# Patient Record
Sex: Female | Born: 1975 | Hispanic: Yes | Marital: Married | Smoking: Former smoker
Health system: Southern US, Community
[De-identification: ages and names within clinical notes are randomized; demographics above are authoritative.]

## PROBLEM LIST (undated history)

## (undated) DIAGNOSIS — O09529 Supervision of elderly multigravida, unspecified trimester: Secondary | ICD-10-CM

## (undated) DIAGNOSIS — S299XXA Unspecified injury of thorax, initial encounter: Secondary | ICD-10-CM

## (undated) HISTORY — PX: CHOLECYSTECTOMY: SHX55

## (undated) HISTORY — DX: Supervision of elderly multigravida, unspecified trimester: O09.529

---

## 2001-11-03 ENCOUNTER — Other Ambulatory Visit: Admission: RE | Admit: 2001-11-03 | Discharge: 2001-11-03 | Payer: Self-pay | Admitting: Obstetrics and Gynecology

## 2002-03-22 ENCOUNTER — Inpatient Hospital Stay (HOSPITAL_COMMUNITY): Admission: AD | Admit: 2002-03-22 | Discharge: 2002-03-25 | Payer: Self-pay | Admitting: *Deleted

## 2002-04-02 ENCOUNTER — Inpatient Hospital Stay (HOSPITAL_COMMUNITY): Admission: AD | Admit: 2002-04-02 | Discharge: 2002-04-02 | Payer: Self-pay | Admitting: *Deleted

## 2004-01-26 ENCOUNTER — Encounter (INDEPENDENT_AMBULATORY_CARE_PROVIDER_SITE_OTHER): Payer: Self-pay | Admitting: *Deleted

## 2004-01-26 ENCOUNTER — Other Ambulatory Visit: Admission: RE | Admit: 2004-01-26 | Discharge: 2004-01-26 | Payer: Self-pay | Admitting: Family Medicine

## 2004-01-26 ENCOUNTER — Ambulatory Visit: Payer: Self-pay | Admitting: Family Medicine

## 2004-02-16 ENCOUNTER — Ambulatory Visit: Payer: Self-pay | Admitting: Family Medicine

## 2005-08-15 ENCOUNTER — Inpatient Hospital Stay (HOSPITAL_COMMUNITY): Admission: AD | Admit: 2005-08-15 | Discharge: 2005-08-17 | Payer: Self-pay | Admitting: Obstetrics

## 2005-08-22 ENCOUNTER — Inpatient Hospital Stay (HOSPITAL_COMMUNITY): Admission: RE | Admit: 2005-08-22 | Discharge: 2005-08-25 | Payer: Self-pay | Admitting: Obstetrics

## 2007-03-04 ENCOUNTER — Inpatient Hospital Stay (HOSPITAL_COMMUNITY): Admission: EM | Admit: 2007-03-04 | Discharge: 2007-03-06 | Payer: Self-pay | Admitting: Emergency Medicine

## 2007-03-05 ENCOUNTER — Encounter (INDEPENDENT_AMBULATORY_CARE_PROVIDER_SITE_OTHER): Payer: Self-pay | Admitting: Surgery

## 2007-03-09 ENCOUNTER — Inpatient Hospital Stay (HOSPITAL_COMMUNITY): Admission: EM | Admit: 2007-03-09 | Discharge: 2007-03-12 | Payer: Self-pay | Admitting: Emergency Medicine

## 2007-07-13 ENCOUNTER — Ambulatory Visit (HOSPITAL_COMMUNITY): Admission: RE | Admit: 2007-07-13 | Discharge: 2007-07-13 | Payer: Self-pay | Admitting: Gastroenterology

## 2008-10-10 ENCOUNTER — Emergency Department (HOSPITAL_COMMUNITY): Admission: EM | Admit: 2008-10-10 | Discharge: 2008-10-11 | Payer: Self-pay | Admitting: Emergency Medicine

## 2008-11-04 ENCOUNTER — Emergency Department (HOSPITAL_COMMUNITY): Admission: EM | Admit: 2008-11-04 | Discharge: 2008-11-04 | Payer: Self-pay | Admitting: Emergency Medicine

## 2008-12-04 ENCOUNTER — Emergency Department (HOSPITAL_COMMUNITY): Admission: EM | Admit: 2008-12-04 | Discharge: 2008-12-04 | Payer: Self-pay | Admitting: Emergency Medicine

## 2008-12-05 ENCOUNTER — Emergency Department (HOSPITAL_COMMUNITY): Admission: EM | Admit: 2008-12-05 | Discharge: 2008-12-06 | Payer: Self-pay | Admitting: Emergency Medicine

## 2009-05-07 ENCOUNTER — Emergency Department (HOSPITAL_COMMUNITY): Admission: EM | Admit: 2009-05-07 | Discharge: 2009-05-07 | Payer: Self-pay | Admitting: Emergency Medicine

## 2010-06-21 LAB — DIFFERENTIAL
Basophils Relative: 0 % (ref 0–1)
Eosinophils Absolute: 0 10*3/uL (ref 0.0–0.7)
Eosinophils Absolute: 0 10*3/uL (ref 0.0–0.7)
Lymphocytes Relative: 14 % (ref 12–46)
Lymphs Abs: 1.2 10*3/uL (ref 0.7–4.0)
Lymphs Abs: 1.9 10*3/uL (ref 0.7–4.0)
Monocytes Absolute: 0.4 10*3/uL (ref 0.1–1.0)
Monocytes Absolute: 0.4 10*3/uL (ref 0.1–1.0)
Monocytes Relative: 4 % (ref 3–12)

## 2010-06-21 LAB — CBC
Platelets: 164 10*3/uL (ref 150–400)
Platelets: 178 10*3/uL (ref 150–400)
RDW: 12.9 % (ref 11.5–15.5)
WBC: 8.5 10*3/uL (ref 4.0–10.5)

## 2010-06-21 LAB — COMPREHENSIVE METABOLIC PANEL
ALT: 96 U/L — ABNORMAL HIGH (ref 0–35)
AST: 41 U/L — ABNORMAL HIGH (ref 0–37)
Albumin: 4 g/dL (ref 3.5–5.2)
Albumin: 4.2 g/dL (ref 3.5–5.2)
Alkaline Phosphatase: 58 U/L (ref 39–117)
BUN: 10 mg/dL (ref 6–23)
Calcium: 8.5 mg/dL (ref 8.4–10.5)
Chloride: 102 mEq/L (ref 96–112)
Creatinine, Ser: 0.47 mg/dL (ref 0.4–1.2)
GFR calc Af Amer: >60 mL/min (ref >60)
GFR calc Af Amer: >60 mL/min (ref >60)
GFR calc non Af Amer: >60 mL/min (ref >60)
Glucose, Bld: 136 mg/dL — ABNORMAL HIGH (ref 70–99)
Potassium: 3.6 mEq/L (ref 3.5–5.1)
Total Bilirubin: 0.8 mg/dL (ref 0.3–1.2)
Total Protein: 7.1 g/dL (ref 6.0–8.3)

## 2010-06-21 LAB — URINALYSIS, ROUTINE W REFLEX MICROSCOPIC
Bilirubin Urine: NEGATIVE
Ketones, ur: NEGATIVE mg/dL
Ketones, ur: NEGATIVE mg/dL
Nitrite: NEGATIVE
Protein, ur: NEGATIVE mg/dL
Urobilinogen, UA: 1 mg/dL (ref 0.0–1.0)
pH: 7.5 (ref 5.0–8.0)

## 2010-06-21 LAB — GC/CHLAMYDIA PROBE AMP, GENITAL: GC Probe Amp, Genital: NEGATIVE

## 2010-06-21 LAB — LIPASE, BLOOD
Lipase: 33 U/L (ref 11–59)
Lipase: 35 U/L (ref 11–59)

## 2010-06-21 LAB — WET PREP, GENITAL

## 2010-06-22 LAB — URINALYSIS, ROUTINE W REFLEX MICROSCOPIC
Glucose, UA: NEGATIVE mg/dL
Hgb urine dipstick: NEGATIVE
Ketones, ur: NEGATIVE mg/dL
Protein, ur: NEGATIVE mg/dL
pH: 6 (ref 5.0–8.0)

## 2010-06-22 LAB — URINE MICROSCOPIC-ADD ON

## 2010-06-22 LAB — POCT PREGNANCY, URINE: Preg Test, Ur: NEGATIVE

## 2010-06-23 LAB — CBC
MCHC: 33.6 g/dL (ref 30.0–36.0)
MCV: 99.2 fL (ref 78.0–100.0)
Platelets: 180 10*3/uL (ref 150–400)
RBC: 4.13 MIL/uL (ref 3.87–5.11)
WBC: 8.2 10*3/uL (ref 4.0–10.5)

## 2010-06-23 LAB — URINALYSIS, ROUTINE W REFLEX MICROSCOPIC
Bilirubin Urine: NEGATIVE
Hgb urine dipstick: NEGATIVE
Nitrite: NEGATIVE
Protein, ur: NEGATIVE mg/dL
Specific Gravity, Urine: 1.01 (ref 1.005–1.030)
Urobilinogen, UA: 0.2 mg/dL (ref 0.0–1.0)

## 2010-06-23 LAB — COMPREHENSIVE METABOLIC PANEL
Alkaline Phosphatase: 44 U/L (ref 39–117)
BUN: 10 mg/dL (ref 6–23)
CO2: 23 mEq/L (ref 19–32)
Chloride: 114 mEq/L — ABNORMAL HIGH (ref 96–112)
GFR calc non Af Amer: >60 mL/min (ref >60)
Glucose, Bld: 94 mg/dL (ref 70–99)
Potassium: 4 mEq/L (ref 3.5–5.1)
Total Bilirubin: 0.3 mg/dL (ref 0.3–1.2)
Total Protein: 7.1 g/dL (ref 6.0–8.3)

## 2010-06-23 LAB — DIFFERENTIAL
Basophils Absolute: 0 10*3/uL (ref 0.0–0.1)
Basophils Relative: 1 % (ref 0–1)
Neutro Abs: 5.8 10*3/uL (ref 1.7–7.7)
Neutrophils Relative %: 70 % (ref 43–77)

## 2010-06-23 LAB — POCT PREGNANCY, URINE: Preg Test, Ur: NEGATIVE
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
## 2010-07-30 NOTE — Consult Note (Signed)
Isabel Castillo, Isabel Castillo          ACCOUNT NO.:  [id number]   MEDICAL RECORD NO.:  [id number]          PATIENT TYPE:  INP   LOCATION:  1528                         FACILITY:  [facility]   PHYSICIAN:  Graylin Shiver, M.D.   DATE OF BIRTH:  1975/07/16   DATE OF CONSULTATION:  DATE OF DISCHARGE:                                 CONSULTATION   REASON FOR CONSULTATION:  The patient is a 35 year old Hispanic female  status post laparoscopic cholecystectomy on March 05, 2007, for  gallstones.  The patient had normal intraoperative cholangiogram at the  time of surgery.  She did well postoperatively until this morning around  4:00 a.m. when she developed abdominal pain.  She states that the pain  is in her abdomen and she points to the left side of her abdomen.  Rubs  her hand up and down the left side but also the epigastric area as well.  She came to the emergency room where she was seen and evaluated by Dr.  Ovidio Kin who is admitting her.  Her liver function tests are  elevated.  Bilirubin is 0.9 but alkaline phosphatase is 204, aspartate  aminotransferase 85, alanine aminotransferase 209.  She has not been  vomiting.  She does tell me that she has not had a bowel movement in  about five days.  Her lipase is normal.   ALLERGIES:  None known.   MEDICATIONS:  None.   PAST MEDICAL HISTORY:  No chronic medical problems.   SOCIAL HISTORY:  Does not smoke or drink alcohol.   REVIEW OF SYSTEMS:  She is not complaining of any chest pain, shortness  of breath, cough or sputum production.   PHYSICAL EXAMINATION:  VITAL SIGNS:  Vital signs are stable.  GENERAL:  She is not in any distress.  HEAD, EARS, EYES,  NOSE AND THROAT:  Nonicteric.  HEART:  Regular rhythm.  No murmurs.  LUNGS:  Clear.  ABDOMEN:  Bowel sounds are normal.  It is soft.  She does have some  tenderness on the left side and also in the epigastric area.  No rebound  or guarding.   IMPRESSION:  1. Abdominal pain.  2.  Elevated liver enzymes.  3. Status post laparoscopic cholecystectomy.  4. Rule out common bile duct stone although she did have normal      intraoperative cholangiogram.  5. Rule out bile leak.   PLAN:  Surgery is admitting her to the hospital.  We will check the HIDA  scan, recheck liver function tests.  She may need an endoscopic  retrograde cholangiopancreatography, not sure yet tonight.  We will  reassess the situation in the morning.           ______________________________  Graylin Shiver, M.D.     SFG/MEDQ  D:  03/09/2007  T:  03/10/2007  Job:  [id number]   cc:   [facility]
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
## 2010-07-30 NOTE — Op Note (Signed)
NAMESU, DUMA          ACCOUNT NO.:  [id number]   MEDICAL RECORD NO.:  [id number]          PATIENT TYPE:  INP   LOCATION:  1528                         FACILITY:  [facility]   PHYSICIAN:  Graylin Shiver, M.D.   DATE OF BIRTH:  Jun 27, 1975   DATE OF PROCEDURE:  03/10/2007  DATE OF DISCHARGE:                               OPERATIVE REPORT   Endoscopic retrograde cholangiogram with biliary stent placement.   INDICATION:  A 35 year old female status post laparoscopic  cholecystectomy a few days ago, presented with abdominal pain.  HIDA  scan showed a bile leak.  ERCP with stent placement planned due to bile  leak.   Informed consent was obtained after explanation of the risks of  bleeding, infection, perforation and pancreatitis.   PREMEDICATION:  Fentanyl 125 mcg IV, Versed 8 mg IV.   PROCEDURE:  With the patient lying on the fluoroscopy table on her  abdomen, the lateral viewing duodenum scope was inserted into the  oropharynx and passed into the esophagus.  It was advanced down the  esophagus, then into the stomach and into the duodenum.  No specific  lesions were seen in the stomach or the duodenum.  The papilla of Vater  looked normal.  Cannulation was achieved initially at one time of the  pancreatic duct with a guidewire.  The catheter was repositioned, and  then the second attempt at cannulation was successful at cannulating the  common bile duct.  The guidewire was advanced up the biliary tree.  Contrast was injected into the biliary tree.  No stones were seen.  There was a lush of contrast seen peripherally out to the right of the  clips consistent with a bile leak.  This may be from a duct of Luschka.  The guidewire was secured, the papillotome was removed, and then a 5-cm  8.5 [place] plastic stent was placed into the bile duct.  She tolerated  the procedure well without complications.   IMPRESSION:  Bile leak, biliary stent placed.     ______________________________  Graylin Shiver, M.D.     SFG/MEDQ  D:  03/10/2007  T:  03/10/2007  Job:  [id number]   cc:   Angelia Mould. Derrell Lolling, M.D.  [address], [address]  [place]  [place] [address]

## 2010-07-30 NOTE — Op Note (Signed)
[name], Isabel Castillo               ACCOUNT NO.:  [id number]   MEDICAL RECORD NO.:  [id number]          PATIENT TYPE:  AMB   LOCATION:  ENDO                         FACILITY:  [facility]   PHYSICIAN:  Graylin Shiver, M.D.   DATE OF BIRTH:  [birth year]   DATE OF PROCEDURE:  07/13/2007  DATE OF DISCHARGE:                               OPERATIVE REPORT   INDICATIONS:  The patient has had a biliary stent placed several months  ago after a bile leak post cholecystectomy.  She is doing well.  It is  now time to remove the stent.   Informed consent was obtained after explanation of the risks of  bleeding, infection, and perforation.   PREMEDICATION:  1. Fentanyl 75 mcg IV.  2. Versed 10 mg IV.   PROCEDURE:  With the patient in the left lateral decubitus position, the  lateral viewing duodenoscope was inserted into the oropharynx and passed  into the esophagus.  It was advanced down the esophagus then into the  stomach.  No lesions were seen.  The duodenum was then entered.  No  lesions were seen.  The biliary stent was seen.  It was grasped with  snare and removed.  She tolerated the procedure well without  complications.   IMPRESSION:  Biliary stent removed.           ______________________________  Graylin Shiver, M.D.     SFG/MEDQ  D:  07/13/2007  T:  07/14/2007  Job:  [id number]   cc:   [facility]
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
## 2010-08-02 NOTE — Discharge Summary (Signed)
NAMEKARY, Isabel Castillo          ACCOUNT NO.:  [id number]   MEDICAL RECORD NO.:  [id number]          PATIENT TYPE:  INP   LOCATION:  1613                         FACILITY:  [facility]   PHYSICIAN:  Angelia Mould. Derrell Lolling, M.D.DATE OF BIRTH:  1975/12/03   DATE OF ADMISSION:  03/09/2007  DATE OF DISCHARGE:  03/12/2007                               DISCHARGE SUMMARY   FINAL DIAGNOSES:  1. Bile leak.  2. Chronic cholecystitis with cholelithiasis, status post laparoscopic      cholecystectomy.   OPERATIONS PERFORMED:  ERCP with sphincterotomy and stent placement.   HISTORY:  This is a 35 year old Hispanic patient who does not speak much  [place].  She was admitted to hospital by Dr. Harriette Bouillon on March 03, 2007 with gallstone pancreatitis.  She recovered from that fairly  quickly, and on March 05, 2007, I took her to the operating room and  did a laparoscopic cholecystectomy.  She had an intraoperative  cholangiogram which was normal.  The surgery was uneventful, and she was  discharged on March 06, 2007.   She was doing well until 4:00 a.m. on the morning of March 09, 2007  when she awoke with epigastric pain radiating to her back, fairly sudden  onset.  She came to the emergency room.  Liver function tests were  slightly elevated.  Bilirubin was normal.  Her lipase was 27.  She was  evaluated by Dr. Ovidio Kin and admitted to the hospital for further  evaluation and management.   PHYSICAL EXAMINATION:  VITAL SIGNS:  Temperature 98.1, blood pressure  117/77, pulse 75, respirations 20.  LUNGS:  Clear to auscultation.  ABDOMEN:  Soft, active bowel sounds.  No guarding, rebound, or  peritoneal signs.  No obvious mass or hernia.   HOSPITAL COURSE:  Dr. Ezzard Standing was concerned about retained common duct  stones as well as leak.  HIDA scan showed a bile leak.  Dr. Evette Cristal was  called.  The patient was placed on antibiotics.  The patient had an ERCP  with a stent placed by Dr.  Evette Cristal.  He felt that there was a small duct  of [place] which was causing the leak.   On March 12, 2007, the patient was doing well, markedly improved  pain, tolerating a diet, no tenderness.  She was discharged home on  March 12, 2007.  She was to follow up with me in the office in 1-2  weeks.  Dr. Evette Cristal will remove the stent in 6-8 weeks.      Angelia Mould. Derrell Lolling, M.D.  Electronically Signed     HMI/MEDQ  D:  03/28/2007  T:  03/29/2007  Job:  [id number]

## 2010-11-10 ENCOUNTER — Emergency Department (HOSPITAL_COMMUNITY)
Admission: EM | Admit: 2010-11-10 | Discharge: 2010-11-10 | Disposition: A | Discharge status: Home / Self Care | Payer: Self-pay | Attending: Emergency Medicine | Admitting: Emergency Medicine

## 2010-11-10 DIAGNOSIS — R109 Unspecified abdominal pain: Secondary | ICD-10-CM | POA: Insufficient documentation

## 2010-11-10 DIAGNOSIS — Z9089 Acquired absence of other organs: Secondary | ICD-10-CM | POA: Insufficient documentation

## 2010-11-10 DIAGNOSIS — R10816 Epigastric abdominal tenderness: Secondary | ICD-10-CM | POA: Insufficient documentation

## 2010-12-20 LAB — COMPREHENSIVE METABOLIC PANEL
ALT: 183 — ABNORMAL HIGH
ALT: 209 — ABNORMAL HIGH
AST: 23
AST: 85 — ABNORMAL HIGH
Albumin: 3.4 — ABNORMAL LOW
Albumin: 4
Alkaline Phosphatase: 204 — ABNORMAL HIGH
Alkaline Phosphatase: 213 — ABNORMAL HIGH
Alkaline Phosphatase: 58
BUN: 1 — ABNORMAL LOW
BUN: 20
CO2: 28
Calcium: 9.1
Chloride: 104
Chloride: 111
Chloride: 99
Creatinine, Ser: 0.52
GFR calc Af Amer: >60
GFR calc Af Amer: >60
GFR calc non Af Amer: >60
Glucose, Bld: 107 — ABNORMAL HIGH
Glucose, Bld: 195 — ABNORMAL HIGH
Potassium: 3.7
Potassium: 3.8
Potassium: 3.9
Sodium: 133 — ABNORMAL LOW
Sodium: 140
Sodium: 142
Total Bilirubin: 0.6
Total Bilirubin: 0.7
Total Bilirubin: 1.1
Total Protein: 5.8 — ABNORMAL LOW
Total Protein: 6.1
Total Protein: 7.6

## 2010-12-20 LAB — DIFFERENTIAL
Basophils Absolute: 0
Basophils Absolute: 0
Basophils Absolute: 0
Basophils Relative: 0
Basophils Relative: 0
Basophils Relative: 0
Eosinophils Absolute: 0 — ABNORMAL LOW
Eosinophils Absolute: 0.1 — ABNORMAL LOW
Lymphocytes Relative: 18
Monocytes Absolute: 0.4
Monocytes Absolute: 0.5
Monocytes Relative: 5
Monocytes Relative: 7
Neutro Abs: 6.9
Neutro Abs: 7.6
Neutrophils Relative %: 77
Neutrophils Relative %: 79 — ABNORMAL HIGH

## 2010-12-20 LAB — URINALYSIS, ROUTINE W REFLEX MICROSCOPIC
Bilirubin Urine: NEGATIVE
Bilirubin Urine: NEGATIVE
Glucose, UA: NEGATIVE
Ketones, ur: 15 — AB
Ketones, ur: NEGATIVE
Nitrite: NEGATIVE
Nitrite: NEGATIVE
Specific Gravity, Urine: 1.024
Urobilinogen, UA: 1
pH: 6

## 2010-12-20 LAB — URINE MICROSCOPIC-ADD ON

## 2010-12-20 LAB — CBC
HCT: 35.9 — ABNORMAL LOW
HCT: 37.3
Hemoglobin: 12.7
Hemoglobin: 12.8
Hemoglobin: 14.4
MCHC: 34.9
MCV: 94.9
MCV: 95.2
Platelets: 169
Platelets: 186
RBC: 3.59 — ABNORMAL LOW
RBC: 3.81 — ABNORMAL LOW
RBC: 4.35
RDW: 13.4
RDW: 13.9
WBC: 5.2
WBC: 8.4
WBC: 9.9

## 2010-12-20 LAB — PREGNANCY, URINE: Preg Test, Ur: NEGATIVE

## 2010-12-20 LAB — AMYLASE
Amylase: 67
Amylase: 73

## 2010-12-20 LAB — URINE CULTURE

## 2010-12-20 LAB — LIPASE, BLOOD
Lipase: 36
Lipase: 969 — ABNORMAL HIGH

## 2010-12-20 LAB — POCT PREGNANCY, URINE: Preg Test, Ur: NEGATIVE

## 2016-01-09 DIAGNOSIS — F331 Major depressive disorder, recurrent, moderate: Secondary | ICD-10-CM | POA: Insufficient documentation

## 2016-07-01 ENCOUNTER — Ambulatory Visit: Payer: Self-pay

## 2016-07-24 ENCOUNTER — Encounter (INDEPENDENT_AMBULATORY_CARE_PROVIDER_SITE_OTHER): Payer: Self-pay | Admitting: Physician Assistant

## 2016-07-24 ENCOUNTER — Ambulatory Visit (INDEPENDENT_AMBULATORY_CARE_PROVIDER_SITE_OTHER): Payer: Self-pay | Admitting: Physician Assistant

## 2016-07-24 VITALS — BP 117/77 | HR 79 | Temp 98.2°F | Ht 62.0 in | Wt 175.6 lb

## 2016-07-24 DIAGNOSIS — K2990 Gastroduodenitis, unspecified, without bleeding: Secondary | ICD-10-CM | POA: Insufficient documentation

## 2016-07-24 DIAGNOSIS — K2970 Gastritis, unspecified, without bleeding: Secondary | ICD-10-CM

## 2016-07-24 DIAGNOSIS — M5442 Lumbago with sciatica, left side: Secondary | ICD-10-CM

## 2016-07-24 DIAGNOSIS — G8929 Other chronic pain: Secondary | ICD-10-CM

## 2016-07-24 DIAGNOSIS — M5441 Lumbago with sciatica, right side: Secondary | ICD-10-CM

## 2016-07-24 MED ORDER — CYCLOBENZAPRINE HCL 5 MG PO TABS: 5.0000 mg | ORAL_TABLET | Freq: Every day | ORAL | 1 refills | Status: DC

## 2016-07-24 MED ORDER — NAPROXEN 500 MG PO TABS: 500.0000 mg | ORAL_TABLET | Freq: Two times a day (BID) | ORAL | 0 refills | Status: DC

## 2016-07-24 MED ORDER — OMEPRAZOLE 40 MG PO CPDR: 40.0000 mg | DELAYED_RELEASE_CAPSULE | Freq: Every day | ORAL | 3 refills | Status: DC

## 2016-07-24 NOTE — Progress Notes (Signed)
Subjective:  Patient ID: Isabel Castillo, female    DOB: 03-19-1975  Age: 41 y.o. MRN: [id number]  CC: back pain  HPI Isabel Castillo is a 41 y.o. 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 Onset of lower back pain one year ago after a fall. Complains of LBP when standing for at least 2-3 hours. Reports bilateral radiculopathy down the LLE and RLE. Pt unable to describe pain but it is not sharp. Pain 8/10 at worst. Aggravated with forward flexion of back and when carrying approximately 20lbs. Also notes sitting for prolonged periods makes her back hurt. Icy Hot relieves pain. Denies saddle paresthesia, urinary/bowel incontinence, HA, CP, or SOB.     ROS Review of Systems  Constitutional: Negative for chills, fever and malaise/fatigue.  Eyes: Negative for blurred vision.  Respiratory: Negative for shortness of breath.   Cardiovascular: Negative for chest pain and palpitations.  Gastrointestinal: Negative for abdominal pain and nausea.  Genitourinary: Negative for dysuria and hematuria.  Musculoskeletal: Positive for back pain. Negative for joint pain and myalgias.  Skin: Negative for rash.  Neurological: Negative for headaches.       Radiculopathy from back pain  Psychiatric/Behavioral: Negative for depression. The patient is not nervous/anxious.     Objective:  BP 117/77 (BP Location: Left Arm, Patient Position: Sitting, Cuff Size: Normal)   Pulse 79   Temp 98.2 F (36.8 C) (Oral)   Ht 5\' 2"  (1.575 m)   Wt 175 lb 9.6 oz (79.7 kg)   LMP 07/09/2016 (Exact Date)   SpO2 97%   BMI 32.12 kg/m   BP/Weight 07/24/2016  Systolic BP 117  Diastolic BP 77  Wt. (Lbs) 175.6  BMI 32.12      Physical Exam  Constitutional: She is oriented to person, place, and time.  Well developed, overweight, NAD, polite  HENT:  Head: Normocephalic and atraumatic.  Eyes: No scleral icterus.  Neck: Normal range  of motion.  Cardiovascular: Normal rate, regular rhythm and normal heart sounds.   Pulmonary/Chest: Effort normal and breath sounds normal.  Musculoskeletal: She exhibits no edema.  Full aROM of back, UEs, and LEs.  Neurological: She is alert and oriented to person, place, and time. No cranial nerve deficit. Coordination normal.  Seated SLR negative bilaterally.  Skin: Skin is warm and dry. No rash noted. No erythema. No pallor.  Psychiatric: She has a normal mood and affect. Her behavior is normal. Thought content normal.  Vitals reviewed.    Assessment & Plan:   1. Chronic bilateral low back pain with bilateral sciatica - DG Lumbar Spine Complete; Future - Begin cyclobenzaprine (FLEXERIL) 5 MG tablet; Take 1 tablet (5 mg total) by mouth at bedtime.  Dispense: 10 tablet; Refill: 1 - Begin naproxen (NAPROSYN) 500 MG tablet; Take 1 tablet (500 mg total) by mouth 2 (two) times daily with a meal.  Dispense: 10 tablet; Refill: 0 * Pt reluctant to take meds but was educated on the importance of reducing inflammation.  2. Gastritis and gastroduodenitis - Begin omeprazole (PRILOSEC) 40 MG capsule; Take 1 capsule (40 mg total) by mouth daily.  Dispense: 30 capsule; Refill: 3   Meds ordered this encounter  Medications  . cyclobenzaprine (FLEXERIL) 5 MG tablet    Sig: Take 1 tablet (5 mg total) by mouth at bedtime.    Dispense:  10 tablet    Refill:  1    Order Specific  Question:   Supervising Provider    Answer:   Quentin AngstJEGEDE, OLUGBEMIGA E [[id number]]  . naproxen (NAPROSYN) 500 MG tablet    Sig: Take 1 tablet (500 mg total) by mouth 2 (two) times daily with a meal.    Dispense:  10 tablet    Refill:  0    Order Specific Question:   Supervising Provider    Answer:   Quentin AngstJEGEDE, OLUGBEMIGA E [id number]]  . omeprazole (PRILOSEC) 40 MG capsule    Sig: Take 1 capsule (40 mg total) by mouth daily.    Dispense:  30 capsule    Refill:  3    Order Specific Question:   Supervising Provider    Answer:    Quentin AngstJEGEDE, OLUGBEMIGA E [[id number]]    Follow-up: Return in about 4 weeks (around 08/21/2016) for full physical .   Loletta Specteroger David Minie Roadcap PA

## 2016-07-24 NOTE — Patient Instructions (Signed)

## 2016-07-28 ENCOUNTER — Ambulatory Visit (HOSPITAL_COMMUNITY)
Admission: RE | Admit: 2016-07-28 | Discharge: 2016-07-28 | Disposition: A | Discharge status: Home / Self Care | Payer: Self-pay | Source: Ambulatory Visit | Attending: Physician Assistant | Admitting: Physician Assistant

## 2016-07-28 ENCOUNTER — Other Ambulatory Visit (INDEPENDENT_AMBULATORY_CARE_PROVIDER_SITE_OTHER): Payer: Self-pay | Admitting: Physician Assistant

## 2016-07-28 DIAGNOSIS — M5442 Lumbago with sciatica, left side: Secondary | ICD-10-CM

## 2016-07-28 DIAGNOSIS — G8929 Other chronic pain: Secondary | ICD-10-CM | POA: Insufficient documentation

## 2016-07-28 DIAGNOSIS — M5441 Lumbago with sciatica, right side: Secondary | ICD-10-CM | POA: Insufficient documentation

## 2016-07-28 DIAGNOSIS — M4306 Spondylolysis, lumbar region: Secondary | ICD-10-CM

## 2016-07-28 NOTE — Progress Notes (Signed)
Pars defect L5 left side.

## 2016-08-06 ENCOUNTER — Ambulatory Visit: Payer: Self-pay | Attending: Physician Assistant

## 2016-08-15 ENCOUNTER — Ambulatory Visit (INDEPENDENT_AMBULATORY_CARE_PROVIDER_SITE_OTHER): Payer: Self-pay | Admitting: Physician Assistant

## 2016-08-15 ENCOUNTER — Ambulatory Visit (INDEPENDENT_AMBULATORY_CARE_PROVIDER_SITE_OTHER): Payer: Self-pay

## 2016-09-08 ENCOUNTER — Ambulatory Visit (INDEPENDENT_AMBULATORY_CARE_PROVIDER_SITE_OTHER): Payer: Self-pay | Admitting: Physician Assistant

## 2016-09-09 ENCOUNTER — Ambulatory Visit (INDEPENDENT_AMBULATORY_CARE_PROVIDER_SITE_OTHER): Payer: Self-pay | Admitting: Orthopaedic Surgery

## 2016-09-09 ENCOUNTER — Ambulatory Visit (INDEPENDENT_AMBULATORY_CARE_PROVIDER_SITE_OTHER): Payer: Self-pay | Admitting: Physician Assistant

## 2016-09-09 ENCOUNTER — Encounter (INDEPENDENT_AMBULATORY_CARE_PROVIDER_SITE_OTHER): Payer: Self-pay | Admitting: Physician Assistant

## 2016-09-09 ENCOUNTER — Other Ambulatory Visit (HOSPITAL_COMMUNITY)
Admission: RE | Admit: 2016-09-09 | Discharge: 2016-09-09 | Disposition: A | Discharge status: Home / Self Care | Payer: Self-pay | Source: Ambulatory Visit | Attending: Physician Assistant | Admitting: Physician Assistant

## 2016-09-09 VITALS — BP 136/83 | HR 74 | Temp 98.4°F | Ht 62.5 in | Wt 174.4 lb

## 2016-09-09 DIAGNOSIS — R21 Rash and other nonspecific skin eruption: Secondary | ICD-10-CM

## 2016-09-09 DIAGNOSIS — B009 Herpesviral infection, unspecified: Secondary | ICD-10-CM | POA: Insufficient documentation

## 2016-09-09 DIAGNOSIS — Z114 Encounter for screening for human immunodeficiency virus [HIV]: Secondary | ICD-10-CM

## 2016-09-09 DIAGNOSIS — Z30013 Encounter for initial prescription of injectable contraceptive: Secondary | ICD-10-CM

## 2016-09-09 DIAGNOSIS — Z124 Encounter for screening for malignant neoplasm of cervix: Secondary | ICD-10-CM

## 2016-09-09 DIAGNOSIS — B9689 Other specified bacterial agents as the cause of diseases classified elsewhere: Secondary | ICD-10-CM | POA: Insufficient documentation

## 2016-09-09 DIAGNOSIS — Z0000 Encounter for general adult medical examination without abnormal findings: Secondary | ICD-10-CM

## 2016-09-09 DIAGNOSIS — Z23 Encounter for immunization: Secondary | ICD-10-CM

## 2016-09-09 LAB — POCT URINALYSIS DIPSTICK
BILIRUBIN UA: NEGATIVE
Blood, UA: NEGATIVE
Glucose, UA: NEGATIVE
KETONES UA: NEGATIVE
LEUKOCYTES UA: NEGATIVE
Nitrite, UA: NEGATIVE
Protein, UA: NEGATIVE
Spec Grav, UA: <=1.005 — AB (ref 1.010–1.025)
Urobilinogen, UA: 0.2 E.U./dL
pH, UA: 6.5 (ref 5.0–8.0)

## 2016-09-09 LAB — POCT URINE PREGNANCY: Preg Test, Ur: NEGATIVE

## 2016-09-09 MED ORDER — CLOTRIMAZOLE 1 % EX CREA: 1.0000 "application " | TOPICAL_CREAM | Freq: Two times a day (BID) | CUTANEOUS | 0 refills | Status: DC

## 2016-09-09 MED ORDER — MEDROXYPROGESTERONE ACETATE 150 MG/ML IM SUSP
150.0000 mg | Freq: Once | INTRAMUSCULAR | Status: AC
  Administered 2016-09-09: 150 mg via INTRAMUSCULAR

## 2016-09-09 NOTE — Progress Notes (Addendum)
Subjective:  Patient ID: Isabel Castillo, female    DOB: 22-Jul-1975  Age: 41 y.o. MRN: [id number]  CC: annual exam  HPI Isabel Castillo is a 41 y.o. female with a PMH of Left sided L5 pars defect presents for an annual exam. Says she feels generally well except for intermittent back pain. Was found to have left sided pars defect by XR on 07/28/16. She has scheduled to receive treatment with a chiropractor. She also has a rash on the RLE since approximately two weeks ago. Has used her daughter's steroid cream and OTC hydrocortisone without relief. She has excoriated the skin due to intense itching. Itching is worse when she touches the lesions.     ROS Review of Systems  Constitutional: Negative for chills, fever and malaise/fatigue.  Eyes: Negative for blurred vision.  Respiratory: Negative for shortness of breath.   Cardiovascular: Negative for chest pain and palpitations.  Gastrointestinal: Negative for abdominal pain and nausea.  Genitourinary: Negative for dysuria and hematuria.  Musculoskeletal: Positive for back pain. Negative for joint pain and myalgias.  Skin: Negative for rash.  Neurological: Negative for tingling and headaches.  Psychiatric/Behavioral: Negative for depression. The patient is not nervous/anxious.     Objective:  BP 136/83 (BP Location: Left Arm, Patient Position: Sitting, Cuff Size: Normal)   Pulse 74   Temp 98.4 F (36.9 C) (Oral)   Ht 5' 2.5" (1.588 m)   Wt 174 lb 6.4 oz (79.1 kg)   LMP 08/30/2016 (Exact Date)   SpO2 99%   BMI 31.39 kg/m   BP/Weight 09/09/2016 07/24/2016  Systolic BP 136 117  Diastolic BP 83 77  Wt. (Lbs) 174.4 175.6  BMI 31.39 32.12      Physical Exam  Constitutional: She is oriented to person, place, and time.  Well developed, overweight, NAD, polite  HENT:  Head: Normocephalic and atraumatic.  Eyes: Conjunctivae and EOM are normal. Pupils are equal, round, and reactive to light. No scleral icterus.  Neck: Normal  range of motion. Neck supple. No thyromegaly present.  Cardiovascular: Normal rate, regular rhythm and normal heart sounds.   Pulmonary/Chest: Effort normal and breath sounds normal. No respiratory distress. She has no wheezes. She has no rales.  Abdominal: Soft. Bowel sounds are normal. She exhibits no distension and no mass. There is no tenderness. There is no rebound and no guarding.  No hepatosplenomegaly   Genitourinary:  Genitourinary Comments: Vagina normal, physiologic discharge, cervix with nabothian cyst, no cervical motion tenderness, no adnexal tenderness or mass bilaterally, no uterine mass or tenderness.  Musculoskeletal: She exhibits no edema or deformity.  LEs, UEs, and back with full aROM. No pain elicited with back ROM.  Lymphadenopathy:    She has no cervical adenopathy.  Neurological: She is alert and oriented to person, place, and time. She has normal reflexes. No cranial nerve deficit. Coordination normal.  Skin: Skin is warm and dry. No rash noted. No erythema. No pallor.  Psychiatric: She has a normal mood and affect. Her behavior is normal. Thought content normal.  Vitals reviewed.    Assessment & Plan:   1. Annual physical exam - CBC with Differential; Future - Comprehensive metabolic panel; Future - Lipid Panel; Future - Urinalysis Dipstick  2. Screening for HIV (human immunodeficiency virus) - HIV antibody; Future  3. Need for tetanus, diphtheria, and acellular pertussis (Tdap) vaccine - Tdap vaccine greater than or equal to 7yo IM  4. Screening for cervical cancer - Cytology - PAP Pierson  5. Rash and nonspecific skin eruption - Suspected eczema by history but steroids did not seem to help. Unable to discern what the rash is 2/2 steroid treatments, age, and multiple excoriations. - Begin clotrimazole (LOTRIMIN) 1 % cream; Apply 1 application topically 2 (two) times daily.  Dispense: 30 g; Refill: 0  6. Encounter for initial prescription of  injectable contraceptive - Administered Depo Provera 150 mg per mL  Meds ordered this encounter  Medications  . clotrimazole (LOTRIMIN) 1 % cream    Sig: Apply 1 application topically 2 (two) times daily.    Dispense:  30 g    Refill:  0    Order Specific Question:   Supervising Provider    Answer:   Quentin Angst [id number]    Follow-up: Return if symptoms worsen or fail to improve.   Loletta Specter PA

## 2016-09-09 NOTE — Patient Instructions (Signed)
Prueba de Papanicolaou  (Pap Test)  ¿POR QUÉ ME DEBO REALIZAR ESTA PRUEBA?  A esta prueba también se la denomina "frotis de Pap". Es una prueba de detección que se utiliza para detectar signos de cáncer de vagina, cuello del útero y útero. La prueba también puede identificar la presencia de infección o cambios precancerosos. El médico probablemente le recomiende que se realice esta prueba en forma regular. Esta prueba puede realizarse de la siguiente manera:  · Cada 3 años, a partir de los 21 años.  · Cada 5 años, en combinación con las pruebas que se realizan para detectar la presencia del virus del papiloma humano (VPH).  · Con mayor o menor frecuencia, en función de otras enfermedades que tenga.  ¿QUÉ TIPO DE MUESTRA SE TOMA?  El médico utilizará un pequeño hisopo de algodón, una espátula de plástico o un cepillo para recolectar una muestra de células de la superficie del cuello del útero. El cuello del útero es la apertura del útero, que también se conoce como matriz. También pueden recolectarse las secreciones del cuello del útero y la vagina.  ¿CÓMO DEBO PREPARARME PARA ESTA PRUEBA?  · Tenga en cuenta en qué etapa del ciclo menstrual se encuentra. Es posible que deba reprogramar la prueba si está menstruando el día en que debe realizársela.  · Si el día en que debe realizarse la prueba tiene una infección vaginal aparente, deberá reprogramar la prueba.  · Pueden pedirle que evite tomar una ducha o baño el día de la prueba o el día anterior.  · Algunos medicamentos pueden provocar resultados anormales de la prueba, como los digitálicos y la tetraciclina. Si toma alguno de estos medicamentos, hable con su médico antes de realizarse la prueba.  ¿QUÉ SIGNIFICAN LOS RESULTADOS?  Los resultados anormales de la prueba pueden indicar diversas enfermedades. Estas pueden incluir lo siguiente:  · Cáncer. Si bien los resultados de la prueba de Papanicolaou no pueden  utilizarse para diagnosticar cáncer de cuello del útero, de vagina o de útero, pueden indicar que existe una posibilidad de presencia de cáncer. En este caso, será necesario realizar pruebas adicionales para determinar la presencia de cáncer.  · Enfermedad de transmisión sexual.  · Infecciones por hongos.  · Infección por parásitos.  · Infección por herpes.  · Una enfermedad que causa o favorece la infertilidad.  Es su responsabilidad retirar el resultado del estudio. Consulte en el laboratorio o en el departamento en el que fue realizado el estudio cuándo y cómo podrá obtener los resultados. Comuníquese con el médico si tiene preguntas sobre los resultados.  Esta información no tiene como fin reemplazar el consejo del médico. Asegúrese de hacerle al médico cualquier pregunta que tenga.  Document Released: 08/20/2007 Document Revised: 03/24/2014 Document Reviewed: 07/25/2013  Elsevier Interactive Patient Education © 2018 Elsevier Inc.

## 2016-09-11 LAB — CYTOLOGY - PAP
Bacterial vaginitis: POSITIVE — AB
CHLAMYDIA, DNA PROBE: NEGATIVE
Candida vaginitis: NEGATIVE
Diagnosis: NEGATIVE
NEISSERIA GONORRHEA: NEGATIVE
TRICH (WINDOWPATH): NEGATIVE

## 2016-09-12 ENCOUNTER — Telehealth (INDEPENDENT_AMBULATORY_CARE_PROVIDER_SITE_OTHER): Payer: Self-pay | Admitting: Physician Assistant

## 2016-09-12 ENCOUNTER — Other Ambulatory Visit (INDEPENDENT_AMBULATORY_CARE_PROVIDER_SITE_OTHER): Payer: Self-pay

## 2016-09-12 ENCOUNTER — Other Ambulatory Visit (INDEPENDENT_AMBULATORY_CARE_PROVIDER_SITE_OTHER): Payer: Self-pay | Admitting: Physician Assistant

## 2016-09-12 ENCOUNTER — Ambulatory Visit (INDEPENDENT_AMBULATORY_CARE_PROVIDER_SITE_OTHER): Payer: Self-pay | Admitting: Orthopaedic Surgery

## 2016-09-12 DIAGNOSIS — M545 Low back pain, unspecified: Secondary | ICD-10-CM

## 2016-09-12 DIAGNOSIS — N760 Acute vaginitis: Principal | ICD-10-CM

## 2016-09-12 DIAGNOSIS — B9689 Other specified bacterial agents as the cause of diseases classified elsewhere: Secondary | ICD-10-CM

## 2016-09-12 DIAGNOSIS — Z0000 Encounter for general adult medical examination without abnormal findings: Secondary | ICD-10-CM

## 2016-09-12 DIAGNOSIS — Z114 Encounter for screening for human immunodeficiency virus [HIV]: Secondary | ICD-10-CM

## 2016-09-12 MED ORDER — METRONIDAZOLE 500 MG PO TABS: 500.0000 mg | ORAL_TABLET | Freq: Two times a day (BID) | ORAL | 0 refills | Status: AC

## 2016-09-12 NOTE — Telephone Encounter (Signed)
Per Sindy Messingoger Gomez PA schedule appt.  Will repeat labs.

## 2016-09-12 NOTE — Progress Notes (Signed)
PAP shows BV.

## 2016-09-12 NOTE — Telephone Encounter (Signed)
FWD to PCP. Tempestt S Roberts, CMA  

## 2016-09-12 NOTE — Progress Notes (Addendum)
Office Visit Note orthopedic consultation   Patient: Isabel Castillo           Date of Birth: 1975/08/29           MRN: [id number] Visit Date: 09/12/2016              Requested by: Loletta Specter, PA-C [address] [place], [place] [address] PCP: Loletta Specter, PA-C   Assessment & Plan: Visit Diagnoses:  1. 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 We will set her up for some physical therapy recheck in 4 weeks and if she's having persistent symptoms we can consider MRI scan imaging. Thank for the opportunity to see her  in consultation.  Follow-Up Instructions: Return in about 4 weeks (around 10/10/2016).   Orders:  No orders of the defined types were placed in this encounter.  No orders of the defined types were placed in this encounter.     Procedures: No procedures performed   Clinical Data: No additional findings.   Subjective: No chief complaint on file.   HPI 41 year old female here with her teenage daughter with problems with low back pain since she fell on the floor at home about one year ago. She states she started hurting about a week after she fell and states his been constant with bilateral buttocks pain and pain that radiates under her legs. She is continuing to work. She's taken anti-inflammatories without relief. She does use some intermittent heat. She denies associated bowel or bladder symptoms. Radiographs 07/28/2016 showed pars defect at L5 on the left with intact pars on the right. She had no spondylolisthesis. In 2010 on pack she had a CT scan for another reason but the pars appears intact on pelvic CT scan at that time. Patient denies chills or fever. Pain is worse if she stands for long time or she walks a lot which is what she does at work. She is taking Naprosyn 500 mg twice a day with some improvement.  Review of  Systems 14 point review of system positive for a previous childbirth, history of gastritis duodenal ulcers, vaginal infection, low back pain 1 year.   Objective: Vital Signs: LMP 08/30/2016 (Exact Date)   Physical Exam  Constitutional: She is oriented to person, place, and time. She appears well-developed.  HENT:  Head: Normocephalic.  Right Ear: External ear normal.  Left Ear: External ear normal.  Eyes: Pupils are equal, round, and reactive to light.  Neck: No tracheal deviation present. No thyromegaly present.  Cardiovascular: Normal rate.   Pulmonary/Chest: Effort normal.  Abdominal: Soft.  Musculoskeletal:  Patient has good hip internal and external rotation without pain. Mild discomfort with straight leg raising at 90. She has some sciatic notch tenderness both right and left minimal trochanteric bursal tenderness. Quad and hamstrings are strong she is able to heel and toe walk. Reflexes are 1+ and symmetrical. No rash over exposed skin of venous stasis changes. Pedal pulses are normal.  Neurological: She is alert and oriented to person, place, and time.  Skin: Skin is warm and dry.  Psychiatric: She has a normal mood and affect. Her behavior is normal.    Ortho Exam no clonus in the lower extremities. Normal nail beds. Good capillary refill. EHL anterior tib peroneal scale gastrocsoleus are strong. Negative Faber test.  Specialty Comments:  No specialty comments available.  Imaging:  No results found.   PMFS History: Patient Active Problem List   Diagnosis Date Noted  . Gastritis and gastroduodenitis 07/24/2016   No past medical history on file.  No family history on file.  No past surgical history on file. Social History   Occupational History  . Not on file.   Social History Main Topics  . Smoking status: Never Smoker  . Smokeless tobacco: Never Used  . Alcohol use Not on file  . Drug use: Unknown  . Sexual activity: Not on file

## 2016-09-12 NOTE — Telephone Encounter (Signed)
Patient came in today stated needs to further discuss lab results regarding pap smear. Concerns regarding infection in vagina, is there a treatment? And how long do you think she has had the infection?  Stated she saw on the a video regarding infection and it said that if just started with infection that there is treatment. Also wants to know if results 100% accurate.  Patient declined appt this time. Stated needs only to ask this question.

## 2016-09-13 LAB — CBC WITH DIFFERENTIAL/PLATELET
BASOS ABS: 0 10*3/uL (ref 0.0–0.2)
Basos: 0 %
EOS (ABSOLUTE): 0.1 10*3/uL (ref 0.0–0.4)
Eos: 1 %
Hematocrit: 39.9 % (ref 34.0–46.6)
Hemoglobin: 13.5 g/dL (ref 11.1–15.9)
IMMATURE GRANS (ABS): 0 10*3/uL (ref 0.0–0.1)
Immature Granulocytes: 0 %
LYMPHS: 31 %
Lymphocytes Absolute: 1.5 10*3/uL (ref 0.7–3.1)
MCH: 33.2 pg — AB (ref 26.6–33.0)
MCHC: 33.8 g/dL (ref 31.5–35.7)
MCV: 98 fL — ABNORMAL HIGH (ref 79–97)
Monocytes Absolute: 0.3 10*3/uL (ref 0.1–0.9)
Monocytes: 6 %
Neutrophils Absolute: 3 10*3/uL (ref 1.4–7.0)
Neutrophils: 62 %
PLATELETS: 228 10*3/uL (ref 150–379)
RBC: 4.07 x10E6/uL (ref 3.77–5.28)
RDW: 14.2 % (ref 12.3–15.4)
WBC: 4.9 10*3/uL (ref 3.4–10.8)

## 2016-09-13 LAB — COMPREHENSIVE METABOLIC PANEL
ALK PHOS: 58 IU/L (ref 39–117)
ALT: 26 IU/L (ref 0–32)
AST: 19 IU/L (ref 0–40)
Albumin/Globulin Ratio: 1.6 (ref 1.2–2.2)
Albumin: 4.2 g/dL (ref 3.5–5.5)
BUN/Creatinine Ratio: 23 (ref 9–23)
BUN: 11 mg/dL (ref 6–20)
Bilirubin Total: 0.4 mg/dL (ref 0.0–1.2)
CO2: 22 mmol/L (ref 20–29)
Calcium: 8.9 mg/dL (ref 8.7–10.2)
Chloride: 105 mmol/L (ref 96–106)
Creatinine, Ser: 0.47 mg/dL — ABNORMAL LOW (ref 0.57–1.00)
GFR calc Af Amer: 144 mL/min/{1.73_m2} (ref >59)
GFR calc non Af Amer: 125 mL/min/{1.73_m2} (ref >59)
GLUCOSE: 100 mg/dL — AB (ref 65–99)
Globulin, Total: 2.7 g/dL (ref 1.5–4.5)
Potassium: 4.1 mmol/L (ref 3.5–5.2)
Sodium: 140 mmol/L (ref 134–144)
Total Protein: 6.9 g/dL (ref 6.0–8.5)

## 2016-09-13 LAB — LIPID PANEL
CHOLESTEROL TOTAL: 197 mg/dL (ref 100–199)
Chol/HDL Ratio: 3 ratio (ref 0.0–4.4)
HDL: 65 mg/dL (ref >39)
LDL Calculated: 118 mg/dL — ABNORMAL HIGH (ref 0–99)
Triglycerides: 71 mg/dL (ref 0–149)
VLDL CHOLESTEROL CAL: 14 mg/dL (ref 5–40)

## 2016-09-13 LAB — HIV ANTIBODY (ROUTINE TESTING W REFLEX): HIV Screen 4th Generation wRfx: NONREACTIVE

## 2016-09-19 ENCOUNTER — Other Ambulatory Visit (INDEPENDENT_AMBULATORY_CARE_PROVIDER_SITE_OTHER): Payer: Self-pay | Admitting: Physician Assistant

## 2016-09-19 DIAGNOSIS — E785 Hyperlipidemia, unspecified: Secondary | ICD-10-CM

## 2016-09-19 MED ORDER — LOVASTATIN 20 MG PO TABS: 20.0000 mg | ORAL_TABLET | Freq: Every day | ORAL | 3 refills | Status: DC

## 2016-09-25 ENCOUNTER — Ambulatory Visit (INDEPENDENT_AMBULATORY_CARE_PROVIDER_SITE_OTHER): Payer: Self-pay | Admitting: Physician Assistant

## 2016-10-01 ENCOUNTER — Encounter (INDEPENDENT_AMBULATORY_CARE_PROVIDER_SITE_OTHER): Payer: Self-pay | Admitting: Physician Assistant

## 2016-10-01 ENCOUNTER — Ambulatory Visit (INDEPENDENT_AMBULATORY_CARE_PROVIDER_SITE_OTHER): Payer: Self-pay | Admitting: Physician Assistant

## 2016-10-01 ENCOUNTER — Ambulatory Visit: Payer: Self-pay | Attending: Orthopaedic Surgery | Admitting: Rehabilitative and Restorative Service Providers"

## 2016-10-01 ENCOUNTER — Encounter: Payer: Self-pay | Admitting: Rehabilitative and Restorative Service Providers"

## 2016-10-01 VITALS — BP 117/75 | HR 81 | Temp 97.9°F | Wt 170.4 lb

## 2016-10-01 DIAGNOSIS — E785 Hyperlipidemia, unspecified: Secondary | ICD-10-CM

## 2016-10-01 DIAGNOSIS — M545 Low back pain: Secondary | ICD-10-CM | POA: Insufficient documentation

## 2016-10-01 DIAGNOSIS — G8929 Other chronic pain: Secondary | ICD-10-CM | POA: Insufficient documentation

## 2016-10-01 DIAGNOSIS — M6281 Muscle weakness (generalized): Secondary | ICD-10-CM | POA: Insufficient documentation

## 2016-10-01 DIAGNOSIS — R293 Abnormal posture: Secondary | ICD-10-CM | POA: Insufficient documentation

## 2016-10-01 DIAGNOSIS — R21 Rash and other nonspecific skin eruption: Secondary | ICD-10-CM

## 2016-10-01 MED ORDER — TRIAMCINOLONE ACETONIDE 0.5 % EX CREA: 1.0000 "application " | TOPICAL_CREAM | Freq: Two times a day (BID) | CUTANEOUS | 0 refills | Status: DC

## 2016-10-01 NOTE — Patient Instructions (Signed)
Eczema (Eczema) El eczema, tambin llamada dermatitis atpica, es una afeccin de la piel que causa inflamacin de la misma. Este trastorno produce una erupcin roja y sequedad y escamas en la piel. Hay gran picazn. El eczema generalmente empeora durante los meses fros del invierno y generalmente desaparece o mejora con el tiempo clido del verano. El eczema generalmente comienza a manifestarse en la infancia. Algunos nios desarrollan este trastorno y ste puede prolongarse en la Estate manager/land agentadultez. CAUSAS La causa exacta no se conoce pero parece ser una afeccin hereditaria. Generalmente las personas que sufren eczema tienen una historia familiar de eczema, alergias, asma o fiebre de heno. Esta enfermedad no es contagiosa. Algunas causas de los brotes pueden ser:  Contacto con alguna cosa a la que es sensible o Best boyalrgico.  Librarian, academicstrs. SIGNOS Y SNTOMAS  Piel seca y escamosa.  Erupcin roja y que pica.  Picazn. Esta puede ocurrir antes de que aparezca la erupcin y puede ser muy intensa.  DIAGNSTICO El diagnstico de eczema se realiza basndose en los sntomas y en la historia clnica. TRATAMIENTO El eczema no puede curarse, pero los sntomas generalmente pueden controlarse con tratamiento y Development worker, communityotras estrategias. Un plan de tratamiento puede incluir:  Control de la picazn y el rascado. ? Utilice antihistamnicos de venta libre segn las indicaciones, para Associate Professoraliviar la picazn. Es especialmente til por las noches cuando la picazn tiende a Theme park managerempeorar. ? Utilice medicamentos de venta libre para la picazn, segn las indicaciones del mdico. ? Evite rascarse. El rascado hace que la picazn empeore. Tambin puede producir una infeccin en la piel (imptigo) debido a las lesiones en la piel causadas por el rascado.  Mantenga la piel bien humectada con cremas, todos Maupinlos das. La piel quedar hmeda y ayudar a prevenir la sequedad. Las lociones que contengan alcohol y agua deben evitarse debido a que pueden  Best boysecar la piel.  Limite la exposicin a las cosas a las que es sensible o alrgico (alrgenos).  Reconozca las situaciones que puedan causar estrs.  Desarrolle un plan para controlar el estrs. INSTRUCCIONES PARA EL CUIDADO EN EL HOGAR  Tome slo medicamentos de venta libre o recetados, segn las indicaciones del mdico.  No aplique nada sobre la piel sin Science writerconsultar a su mdico.  Deber tomar baos o duchas de corta duracin (5 minutos) en agua tibia (no caliente). Use jabones suaves para el bao. No deben tener perfume. Puede agregar aceite de bao no perfumado al agua del bao. Es Manufacturing engineermejor evitar el jabn y el bao de espuma.  Inmediatamente despus del bao o de la ducha, cuando la piel aun est hmeda, aplique una crema humectante en todo el cuerpo. Este ungento debe ser en base a vaselina. La piel quedar hmeda y ayudar a prevenir la sequedad. Cuanto ms espeso sea el ungento, mejor. No deben tener perfume.  Mantenga las uas cortas. Es posible que los nios con eczema necesiten usar guantes o mitones por la noche, despus de aplicarse el ungento.  Vista al McGraw-Hillnio con ropa de algodn o Chief of Staffmezcla de algodn. Vstalo con ropas ligeras ya que el calor aumenta la picazn.  Un nio con eczema debe permanecer alejado de personas que tengan ampollas febriles o llagas del resfro. El virus que causa las ampollas febriles (herpes simple) puede ocasionar una infeccin grave en la piel de los nios que padecen eczema.  SOLICITE ATENCIN MDICA SI:  La picazn le impide dormir.  La erupcin empeora o no mejora dentro de la semana en la que se inicia el  Observa pus o costras amarillas en la zona de la erupcin.  Tiene fiebre.  Aparece un brote despus de haber estado en contacto con alguna persona que tiene ampollas febriles. Esta informacin no tiene como fin reemplazar el consejo del mdico. Asegrese de hacerle al mdico cualquier pregunta que tenga. Document Released:  03/03/2005 Document Revised: 12/22/2012 Document Reviewed: 10/04/2012 Elsevier Interactive Patient Education  2017 Elsevier Inc.  

## 2016-10-01 NOTE — Therapy (Signed)
[facility] [address] [place], [place], [address] Phone: [phone]   Fax:  [phone]  Physical Therapy Evaluation  Patient Details  Name: Isabel StagerMonica Soto Castillo MRN: [id number] Date of Birth: [birth year] Referring Provider: Annell GreeningMark Yates, MD  Encounter Date: 10/01/2016      PT End of Session - 10/01/16 1128    Visit Number 1   Number of Visits 12   Date for PT Re-Evaluation 11/12/16   PT Start Time 1020   PT Stop Time 1104   PT Time Calculation (min) 44 min   Activity Tolerance Patient tolerated treatment well;No increased pain   Behavior During Therapy [facility] for tasks assessed/performed      History reviewed. No pertinent past medical history.  History reviewed. No pertinent surgical history.  There were no vitals filed for this visit.       Subjective Assessment - 10/01/16 1024    Subjective I have been having LBP when I walk or stand too   Pt needs interpreter   Patient is accompained by: Family member   Pertinent History LBP x 1 year after a fall where she slipped and fell backwards. Saw a chiropractor for about 4 visits after fall and was not pleased. 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 3 hours   How long can you stand comfortably? 3 hours   How long can you walk comfortably? can keep walking but hurts, unable to give pain number   Diagnostic tests pars defect on the L of L5   Patient Stated Goals to not have pain   Currently in Pain? Yes   Pain Score 7    Pain Location Back   Pain Orientation Right;Left   Pain Descriptors / Indicators --  unable to describe pain   Pain Type Chronic pain   Pain Radiating Towards bil posterior mid Hamstrings   Pain Onset More than a month ago   Pain Frequency Intermittent   Aggravating Factors  static standing (unable to give time), bending to the side to use machine at work, carrying  heavy items   Pain Relieving Factors icy hot spray, leaning to the side to offset weight   Effect of Pain on Daily Activities able to perform activities but modifies to be less pain.   Multiple Pain Sites No            [facility] PT Assessment - 10/01/16 0001      Assessment   Medical Diagnosis LBP with L5 pars defect on the L   Referring Provider Annell GreeningMark Yates, MD   Onset Date/Surgical Date --  over a year ago   Hand Dominance Right   Next MD Visit TBD   Prior Therapy none     Precautions   Precautions None     Restrictions   Weight Bearing Restrictions No     Balance Screen   Has the patient fallen in the past 6 months No     Home Environment   Living Environment Private residence     Prior Function   Level of Independence Independent     Cognition   Overall Cognitive Status Within Functional Limits for tasks assessed     Observation/Other Assessments   Observations none     Sensation   Light Touch Appears Intact     Coordination   Gross Motor Movements are Fluid and  Coordinated Yes   Fine Motor Movements are Fluid and Coordinated Yes     Squat   Comments unequal WB     Sit to Stand   Comments [facility]     Posture/Postural Control   Posture Comments sits slouched, offsets weight frequently     ROM / Strength   AROM / PROM / Strength AROM;Strength     AROM   Overall AROM Comments lumbar AROM WNL except bil rot 25% limited;  L2-3 pivot point.with extension, pt deviates to R too with extension.      Strength   Overall Strength Comments bil LE strength 5/5; lumbar/core strength poor +     Flexibility   Soft Tissue Assessment /Muscle Length --  Hamstrings tight bil lacking 20-25 degrees to full extension     Palpation   Spinal mobility WNL   SI assessment  L sacral border deeper in prone   Palpation comment R Hamstring tighter, R thoracolumbar paraspinals tighter beginning at T8, R ITB/Piriformis tighter, L sacral border deeper, R ASIS higher, leg length =      Special Tests    Special Tests --  SLR + bil for Hamstring tightness            Objective measurements completed on examination: See above findings.                  PT Education - 10/01/16 1120    Education provided Yes   Education Details HEP: supine Hamstring stretch with towel, prayer stretch in neutral and each side 2x30 sec each 2x/day   Person(s) Educated Patient;Child(ren);Caregiver(s)   Methods Explanation;Demonstration;Handout   Comprehension Verbalized understanding;Returned demonstration          PT Short Term Goals - 10/01/16 1124      PT SHORT TERM GOAL #1   Title Pt will be I with initial and intermediate HEP to assist with LBP pain management and flexiblity with work duties   Baseline issued at eval   Time 3   Period Weeks   Status New     PT SHORT TERM GOAL #2   Title Pt will demo proper body mechanics lifting 10 lb box for work simulation    Baseline unable   Time 3   Period Weeks   Status New     PT SHORT TERM GOAL #3   Title Pt will be able to statically stand x 5 min with 50% bil LBP and bil gluteal/Hamstring pain to perform ADLs/work duties   Baseline unable   Time 3   Period Weeks   Status New           PT Long Term Goals - 10/01/16 1126      PT LONG TERM GOAL #1   Title Pt will demo improved lumbar/core strength to good + to assist with decreased LBP with all activities   Baseline poor +   Time 6   Period Weeks   Status New     PT LONG TERM GOAL #2   Title Pt will demo improved lumbar/LE pain to </= 3/10 after work shift   Baseline 7/10   Time 6   Period Weeks   Status New     PT LONG TERM GOAL #3   Title Pt will be able to use machine at work (where she needs to laterally bend to use) with 75% less pain   Baseline unable   Time 6   Period Weeks   Status New  Plan - 10/01/16 1121    Clinical Impression Statement Pt presents to PT with sacral symmetry, lumbar/core weakness, bil  Hamstring Piriformis tightness, R thoracolumbar paraspinal tightness, R Piriformis/ITB tightness. Pt would benefit from PT for lumbar/core strengthening and lumbar flexibility, manual therapy for SI correction and improvement in lumbar flexibility, R hip flexibility, and body mechanics training. 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 MD order also states spondylolisthesis.    History and Personal Factors relevant to plan of care: pars defect L5, spondylolisthesis   Clinical Presentation Stable   Clinical Decision Making Low   Rehab Potential Excellent   PT Frequency 2x / week   PT Duration 6 weeks   PT Treatment/Interventions ADLs/Self Care Home Management;Cryotherapy;Electrical Stimulation;Iontophoresis 4mg /ml Dexamethasone;Functional mobility training;Ultrasound;Moist Heat;Therapeutic activities;Therapeutic exercise;Patient/family education;Manual techniques;Dry needling;Taping   PT Next Visit Plan review HEP, lumbar flexibility, lumbar/core strengthening, SI assessment and correction   PT Home Exercise Plan see pt education   Recommended Other Services needs interpreter   Consulted and Agree with Plan of Care Patient;Family member/caregiver   Family Member Consulted daughter      Patient will benefit from skilled therapeutic intervention in order to improve the following deficits and impairments:  Decreased mobility, Decreased range of motion, Decreased strength, Impaired flexibility, Postural dysfunction, Pain  Visit Diagnosis: Chronic midline low back pain without sciatica - Plan: PT plan of care cert/re-cert, PT plan of care cert/re-cert  Muscle weakness (generalized) - Plan: PT plan of care cert/re-cert, PT plan of care cert/re-cert  Posture abnormality - Plan: PT plan of care cert/re-cert, PT plan of care cert/re-cert     Problem List Patient Active Problem List   Diagnosis Date Noted  . Gastritis and gastroduodenitis 07/24/2016    Isabel Castillo,Isabel Castillo, PT 10/01/2016, 11:37 AM  [facility] [address] [place], [place], [address] Phone: [phone]   Fax:  [phone]  Name: Isabel Castillo MRN: [id number] Date of Birth: 1975-03-22

## 2016-10-01 NOTE — Progress Notes (Signed)
Pt complains of her head spinning, she states that after drinking a few cups of water it goes away.  She is also requesting a different cream for her rash, states the current one she is using is not helping

## 2016-10-01 NOTE — Progress Notes (Signed)
Subjective:  Patient ID: Isabel Castillo, female    DOB: July 01, 1975  Age: 41 y.o. MRN: [id number]  CC: lab results  HPI Isabel Castillo is a 41 y.o. female with no significant PMH presents to f/u on labs. She was called with an interpreter on 09/19/16. Says she is worried about her positive "Gonorrhea" result. Patient's PAP cytology result revealed Gardnerella.     Also states her RLE rash is still pruritic. Initially tried OTC hydrocortisone with no relief. Subsequently prescribed Clotrimazole which has also failed. Rash is reported to appear on her LLE also but the rashes always self resolve.     Lastly, patient had an three episodes of short lived vertigo within approximately 7 days. Had similar episodes that self resolved in the remote past. No associated headache, hearing loss, tinnitus, or f/c/n/v.     Outpatient Medications Prior to Visit  Medication Sig Dispense Refill  . citalopram (CELEXA) 20 MG tablet Celexa 20 mg tablet  Take 1 tablet every day by oral route in the morning.    . clotrimazole (LOTRIMIN) 1 % cream Apply 1 application topically 2 (two) times daily. 30 g 0  . cyclobenzaprine (FLEXERIL) 5 MG tablet Take 1 tablet (5 mg total) by mouth at bedtime. (Patient not taking: Reported on 10/01/2016) 10 tablet 1  . lovastatin (MEVACOR) 20 MG tablet Take 1 tablet (20 mg total) by mouth at bedtime. 90 tablet 3  . naproxen (NAPROSYN) 500 MG tablet Take 1 tablet (500 mg total) by mouth 2 (two) times daily with a meal. 10 tablet 0  . omeprazole (PRILOSEC) 40 MG capsule Take 1 capsule (40 mg total) by mouth daily. 30 capsule 3   No facility-administered medications prior to visit.      ROS Review of Systems  Constitutional: Negative for chills, fever and malaise/fatigue.  Eyes: Negative for blurred vision.  Respiratory: Negative for shortness of breath.   Cardiovascular: Negative for chest pain and palpitations.  Gastrointestinal: Negative for abdominal pain and nausea.   Genitourinary: Negative for dysuria and hematuria.  Musculoskeletal: Negative for joint pain and myalgias.  Skin: Positive for rash.  Neurological: Negative for tingling and headaches.  Psychiatric/Behavioral: Negative for depression. The patient is not nervous/anxious.     Objective:  BP 117/75 (BP Location: Left Arm, Patient Position: Sitting, Cuff Size: Normal)   Pulse 81   Temp 97.9 F (36.6 C) (Oral)   Wt 170 lb 6.4 oz (77.3 kg)   LMP 08/30/2016 (Approximate)   SpO2 98%   BMI 30.67 kg/m   BP/Weight 10/01/2016 09/09/2016 07/24/2016  Systolic BP 117 136 117  Diastolic BP 75 83 77  Wt. (Lbs) 170.4 174.4 175.6  BMI 30.67 31.39 32.12      Physical Exam  Constitutional: She is oriented to person, place, and time.  Well developed, well nourished, NAD, polite  HENT:  Head: Normocephalic and atraumatic.  Eyes: No scleral icterus.  Neck: Normal range of motion. Neck supple. No thyromegaly present.  Cardiovascular: Normal rate, regular rhythm and normal heart sounds.   Pulmonary/Chest: Effort normal and breath sounds normal.  Abdominal: Soft. Bowel sounds are normal. There is no tenderness.  Musculoskeletal: She exhibits no edema.  Neurological: She is alert and oriented to person, place, and time.  Skin: Skin is warm and dry. No rash noted. No erythema. No pallor.  Patch of mild postinflammatory hyperpigmentation on the pretibial region of the RLE. Small area in patch with tiny nonvesicular papules. No cellulitis, induration, increased warmth, edema,  or tenderness.  Psychiatric: She has a normal mood and affect. Her behavior is normal. Thought content normal.  Vitals reviewed.    Assessment & Plan:   1. Rash and nonspecific skin eruption - Suspected eczema.  - Begin triamcinolone cream (KENALOG) 0.5 %; Apply 1 application topically 2 (two) times daily. Do not use for more than 7 consecutive days without medical approval.  Dispense: 30 g; Refill: 0  2. Hyperlipidemia,  unspecified hyperlipidemia type - Begin Lovastatin 20 mg qhs   Meds ordered this encounter  Medications  . triamcinolone cream (KENALOG) 0.5 %    Sig: Apply 1 application topically 2 (two) times daily. Do not use for more than 7 consecutive days without medical approval.    Dispense:  30 g    Refill:  0    Order Specific Question:   Supervising Provider    Answer:   Quentin AngstJEGEDE, OLUGBEMIGA E [[id number]]    Follow-up: Return if symptoms worsen or fail to improve.   Loletta Specteroger David Tajon Moring PA

## 2016-10-06 ENCOUNTER — Ambulatory Visit: Payer: Self-pay | Admitting: Physical Therapy

## 2016-10-06 DIAGNOSIS — R293 Abnormal posture: Secondary | ICD-10-CM

## 2016-10-06 DIAGNOSIS — M545 Low back pain: Principal | ICD-10-CM

## 2016-10-06 DIAGNOSIS — M6281 Muscle weakness (generalized): Secondary | ICD-10-CM

## 2016-10-06 DIAGNOSIS — G8929 Other chronic pain: Secondary | ICD-10-CM

## 2016-10-06 NOTE — Therapy (Signed)
[facility] [address] [place], [place], [address] Phone: [phone]   Fax:  [phone]  Physical Therapy Treatment  Patient Details  Name: Isabel Castillo MRN: [id number] Date of Birth: 07-31-1975 Referring Provider: Annell Greening, MD  Encounter Date: 10/06/2016      PT End of Session - 10/06/16 1020    Visit Number 2   Number of Visits 12   Date for PT Re-Evaluation 11/12/16   PT Start Time 1018   PT Stop Time 1100   PT Time Calculation (min) 42 min      No past medical history on file.  No past surgical history on file.  There were no vitals filed for this visit.      Subjective Assessment - 10/06/16 1020    Currently in Pain? No/denies                         [facility] Adult PT Treatment/Exercise - 10/06/16 0001      Lumbar Exercises: Stretches   Active Hamstring Stretch 3 reps;30 seconds   Lower Trunk Rotation 3 reps;30 seconds   Pelvic Tilt Limitations 10 reps      Lumbar Exercises: Aerobic   Stationary Bike Nustep L 5 x 5 minutes LE/UE      Lumbar Exercises: Supine   Ab Set 5 reps   Clam 20 reps   Clam Limitations with abdominal draw in, unilateral and bilateral clams   Heel Slides 20 reps   Heel Slides Limitations with abdominal draw in   Bent Knee Raise 20 reps   Bent Knee Raise Limitations with abdominal draw in    Straight Leg Raise 10 reps   Straight Leg Raises Limitations with abdominal draw in   Other Supine Lumbar Exercises abdominal draw in with bilateral clams, red band                 PT Education - 10/06/16 1107    Education provided Yes   Education Details HEP, Abdominal brace with clam, marching, heel slides    Person(s) Educated Patient   Methods Explanation;Handout   Comprehension Verbalized understanding          PT Short Term Goals - 10/01/16 1124      PT SHORT TERM GOAL #1   Title Pt will be I with initial and intermediate HEP to assist with  LBP pain management and flexiblity with work duties   Baseline issued at eval   Time 3   Period Weeks   Status New     PT SHORT TERM GOAL #2   Title Pt will demo proper body mechanics lifting 10 lb box for work simulation    Baseline unable   Time 3   Period Weeks   Status New     PT SHORT TERM GOAL #3   Title Pt will be able to statically stand x 5 min with 50% bil LBP and bil gluteal/Hamstring pain to perform ADLs/work duties   Baseline unable   Time 3   Period Weeks   Status New           PT Long Term Goals - 10/01/16 1126      PT LONG TERM GOAL #1   Title Pt will demo improved lumbar/core strength to good + to assist with decreased LBP with all activities   Baseline poor +   Time 6   Period Weeks   Status New  PT LONG TERM GOAL #2   Title Pt will demo improved lumbar/LE pain to </= 3/10 after work shift   Baseline 7/10   Time 6   Period Weeks   Status New     PT LONG TERM GOAL #3   Title Pt will be able to use machine at work (where she needs to laterally bend to use) with 75% less pain   Baseline unable   Time 6   Period Weeks   Status New               Plan - 10/06/16 1108    Clinical Impression Statement Pt arrives with no pain. Reviewed initial HEP and added initial core exercises. No c/o pain during or after treatment. Interpreter not present. Pt and therapist agreeable to using pt's daughter as interpreter.    PT Next Visit Plan review HEP, lumbar flexibility, lumbar/core strengthening, SI assessment and correction; add piriformis and ITB stretch   PT Home Exercise Plan hamstring stretch, childs pose with laterals, abdominal draw in with clams, march and heel slides    Consulted and Agree with Plan of Care Patient;Family member/caregiver   Family Member Consulted daughter      Patient will benefit from skilled therapeutic intervention in order to improve the following deficits and impairments:  Decreased mobility, Decreased range of  motion, Decreased strength, Impaired flexibility, Postural dysfunction, Pain  Visit Diagnosis: Chronic midline low back pain without sciatica  Muscle weakness (generalized)  Posture abnormality     Problem List Patient Active Problem List   Diagnosis Date Noted  . Gastritis and gastroduodenitis 07/24/2016    Sherrie Mustacheonoho, Kohana Amble McGee, PTA 10/06/2016, 11:14 AM  [facility] [address] [place], [place], [address] Phone: [phone]   Fax:  [phone]  Name: Isabel Castillo MRN: [id number] Date of Birth: 1975/07/11

## 2016-10-07 ENCOUNTER — Encounter: Payer: Self-pay | Admitting: Rehabilitative and Restorative Service Providers"

## 2016-10-10 ENCOUNTER — Ambulatory Visit: Payer: Self-pay | Admitting: Rehabilitative and Restorative Service Providers"

## 2016-10-10 ENCOUNTER — Encounter (INDEPENDENT_AMBULATORY_CARE_PROVIDER_SITE_OTHER): Payer: Self-pay

## 2016-10-10 DIAGNOSIS — R293 Abnormal posture: Secondary | ICD-10-CM

## 2016-10-10 DIAGNOSIS — G8929 Other chronic pain: Secondary | ICD-10-CM

## 2016-10-10 DIAGNOSIS — M545 Low back pain, unspecified: Secondary | ICD-10-CM

## 2016-10-10 DIAGNOSIS — M6281 Muscle weakness (generalized): Secondary | ICD-10-CM

## 2016-10-10 NOTE — Therapy (Signed)
[facility] [address] [place], [place], [address] Phone: [phone]   Fax:  [phone]  Physical Therapy Treatment  Patient Details  Name: Isabel StagerMonica Soto Castillo MRN: [id number] Date of Birth: Jun 08, 1975 Referring Provider: Annell GreeningMark Yates, MD  Encounter Date: 10/10/2016      PT End of Session - 10/10/16 1032    Visit Number 3   Number of Visits 12   Date for PT Re-Evaluation 11/12/16   PT Start Time 1022   PT Stop Time 1104   PT Time Calculation (min) 42 min   Activity Tolerance Patient tolerated treatment well;No increased pain   Behavior During Therapy [facility] for tasks assessed/performed      No past medical history on file.  No past surgical history on file.  There were no vitals filed for this visit.      Subjective Assessment - 10/10/16 1028    Subjective No pain. Worked a little and had pain yesterday.   Patient is accompained by: Family member   Limitations Standing   How long can you sit comfortably? 3 hours   How long can you stand comfortably? 3 hours   How long can you walk comfortably? can keep walking but hurts, unable to give pain number   Diagnostic tests pars defect on the L of L5   Patient Stated Goals to not have pain   Currently in Pain? No/denies                         [facility]/Ex[facility] - 10/10/16 0001      Lumbar Exercises: Stretches   Single Knee to Chest Stretch --  seated lumbar flex with ball on floor 2x15 sec     Lumbar Exercises: Aerobic   Stationary Bike squats with tilt x 10 with PT verbal and visual cues for posture and maintaining tilt     Lumbar Exercises: Supine   Other Supine Lumbar Exercises tilt with mod PT verbal and tacile cues for technique; tilt with bil shoulder flex/ext x 20; tilt with SLR x 20; tilt with bridge x 20; tilt with isometric bridge with clam shell x 20; tilt with theraball roll to knees x 20; tilt with ball reach diagonally fwd  (small crunch) x 20;      Lumbar Exercises: Prone   Other Prone Lumbar Exercises tilt with unilat hip ext bil x 15; prone heel squeeze x 20; donkey kicks x 20 unilat bil;      Lumbar Exercises: Quadruped   Single Arm Raise Right;Left;20 reps   Straight Leg Raise 20 reps;Other (comment)  alternating   Other Quadruped Lumbar Exercises fire hydrant x 15 unilat bil; prayer stretch 2x30 sec each direction;                   PT Short Term Goals - 10/10/16 1031      PT SHORT TERM GOAL #1   Title Pt will be I with initial and intermediate HEP to assist with LBP pain management and flexiblity with work duties   Time 3   Period Weeks   Status On-going     PT SHORT TERM GOAL #2   Title Pt will demo proper body mechanics lifting 10 lb box for work simulation    Time 3   Period Weeks   Status On-going     PT SHORT TERM GOAL #3   Title Pt will be able to statically stand x 5  min with 50% bil LBP and bil gluteal/Hamstring pain to perform ADLs/work duties   Time 3   Period Weeks   Status On-going           PT Long Term Goals - 10/01/16 1126      PT LONG TERM GOAL #1   Title Pt will demo improved lumbar/core strength to good + to assist with decreased LBP with all activities   Baseline poor +   Time 6   Period Weeks   Status New     PT LONG TERM GOAL #2   Title Pt will demo improved lumbar/LE pain to </= 3/10 after work shift   Baseline 7/10   Time 6   Period Weeks   Status New     PT LONG TERM GOAL #3   Title Pt will be able to use machine at work (where she needs to laterally bend to use) with 75% less pain   Baseline unable   Time 6   Period Weeks   Status New               Plan - 10/10/16 1030    Clinical Impression Statement Pt presents to PT with 0/10 LBP. Continue lumbar/core strengthening for further lumbar stabilization and to reduce risk of LBP reoccurence. Daughter interpreted.   Rehab Potential Excellent   PT Frequency 2x / week   PT  Duration 6 weeks   PT Treatment/Interventions ADLs/Self Care Home Management;Cryotherapy;Electrical Stimulation;Iontophoresis 4mg /ml Dexamethasone;Functional mobility training;Ultrasound;Moist Heat;Therapeutic activities;Therapeutic exercise;Patient/family education;Manual techniques;Dry needling;Taping   PT Next Visit Plan lumbar/core strengthening   PT Home Exercise Plan hamstring stretch, childs pose with laterals, abdominal draw in with clams, march and heel slides    Consulted and Agree with Plan of Care Patient;Family member/caregiver   Family Member Consulted daughter      Patient will benefit from skilled therapeutic intervention in order to improve the following deficits and impairments:  Decreased mobility, Decreased range of motion, Decreased strength, Impaired flexibility, Postural dysfunction, Pain  Visit Diagnosis: Chronic midline low back pain without sciatica  Muscle weakness (generalized)  Posture abnormality     Problem List Patient Active Problem List   Diagnosis Date Noted  . Gastritis and gastroduodenitis 07/24/2016    Thornell SartoriusARTIS,Hadlei Stitt , PT 10/10/2016, 11:05 AM  [facility] [address] [place], [place], [address] Phone: [phone]   Fax:  [phone]  Name: Isabel StagerMonica Soto Castillo MRN: [id number] Date of Birth: 10/26/1975

## 2016-10-15 ENCOUNTER — Encounter: Payer: Self-pay | Admitting: Physical Therapy

## 2016-10-15 ENCOUNTER — Ambulatory Visit: Payer: Self-pay | Attending: Orthopaedic Surgery | Admitting: Physical Therapy

## 2016-10-15 DIAGNOSIS — M545 Low back pain: Secondary | ICD-10-CM | POA: Insufficient documentation

## 2016-10-15 DIAGNOSIS — M6281 Muscle weakness (generalized): Secondary | ICD-10-CM | POA: Insufficient documentation

## 2016-10-15 DIAGNOSIS — R293 Abnormal posture: Secondary | ICD-10-CM | POA: Insufficient documentation

## 2016-10-15 DIAGNOSIS — G8929 Other chronic pain: Secondary | ICD-10-CM | POA: Insufficient documentation

## 2016-10-15 NOTE — Therapy (Signed)
[place], [place], [address] Phone: [phone]   Fax:  [phone]  Physical Therapy Treatment  Patient Details  Name: Isabel Castillo MRN: [id number] Date of Birth: 1975/09/03 Referring Provider: Rodell Perna, MD  Encounter Date: 10/15/2016      PT End of Session - 10/15/16 1421    Visit Number 4   Number of Visits 12   Date for PT Re-Evaluation 11/12/16   PT Start Time 8588   PT Stop Time 1458   PT Time Calculation (min) 38 min   Activity Tolerance Patient tolerated treatment well   Behavior During Therapy Isabel Castillo for tasks assessed/performed      History reviewed. No pertinent past medical history.  History reviewed. No pertinent surgical history.  There were no vitals filed for this visit.      Subjective Assessment - 10/15/16 1421    Subjective "Sometimes hurts a little hurts a little at work"   Currently in Pain? No/denies   Pain Score 0-No pain   Aggravating Factors  after work with lifting activities    Pain Relieving Factors stretching/ icy hot.                          [facility] Adult PT Treatment/Exercise - 10/15/16 1436      Lumbar Exercises: Seated   Hip Flexion on Ball 15 reps  seated on physioball     Lumbar Exercises: Supine   Bridge 10 reps  x 3 sets with posterior pelvic tilt, with ball between the k   Bridge Limitations 1 x 10 with alternating LE kickouts   Other Supine Lumbar Exercises table top positoin 5 x 10 sec with ADIM     Lumbar Exercises: Quadruped   Madcat/Old Horse 15 reps     Knee/Hip Exercises: Supine   Straight Leg Raises 2 sets;15 reps     Manual Therapy   Manual Therapy Muscle Energy Technique;Joint mobilization   Joint Mobilization Long axis distraction grade 5   Muscle Energy Technique scissor technique with resited L hip flexion and R hip extension 10 x 10 with 10 sec hold                  PT Short Term Goals -  10/10/16 1031      PT SHORT TERM GOAL #1   Title Pt will be I with initial and intermediate HEP to assist with LBP pain management and flexiblity with work duties   Time 3   Period Weeks   Status On-going     PT SHORT TERM GOAL #2   Title Pt will demo proper body mechanics lifting 10 lb box for work simulation    Time 3   Period Weeks   Status On-going     PT SHORT TERM GOAL #3   Title Pt will be able to statically stand x 5 min with 50% bil LBP and bil gluteal/Hamstring pain to perform ADLs/work duties   Time 3   Period Weeks   Status On-going           PT Long Term Goals - 10/01/16 1126      PT LONG TERM GOAL #1   Title Pt will demo improved lumbar/core strength to good + to assist with decreased LBP with all activities   Baseline poor +   Time 6   Period Weeks   Status New     PT LONG TERM GOAL #  2   Title Pt will demo improved lumbar/LE pain to </= 3/10 after work shift   Baseline 7/10   Time 6   Period Weeks   Status New     PT LONG TERM GOAL #3   Title Pt will be able to use machine at work (where she needs to laterally bend to use) with 75% less pain   Baseline unable   Time 6   Period Weeks   Status New               Plan - 10/15/16 1456    Clinical Impression Statement pt reports no pain today but states it occurs with lifting and carrying activites with work. she demonstrates possible posteriorly rotated innominate on the L. focused on hamstring stretching and MET techniques. continued core strengthening which she required tactile cues for proper form. pt's daughter interpreted.    PT Treatment/Interventions ADLs/Self Care Home Management;Cryotherapy;Electrical Stimulation;Iontophoresis 41m/ml Dexamethasone;Functional mobility training;Ultrasound;Moist Heat;Therapeutic activities;Therapeutic exercise;Patient/family education;Manual techniques;Dry needling;Taping   PT Next Visit Plan lumbar/core strengthening, endurance training, posture/ lifting  mechanics.. possible L posteriorly rotated innominate.    PT Home Exercise Plan hamstring stretch, childs pose with laterals, abdominal draw in with clams, march and heel slides    Consulted and Agree with Plan of Care Patient      Patient will benefit from skilled therapeutic intervention in order to improve the following deficits and impairments:     Visit Diagnosis: Chronic midline low back pain without sciatica  Posture abnormality  Muscle weakness (generalized)     Problem List Patient Active Problem List   Diagnosis Date Noted  . Gastritis and gastroduodenitis 07/24/2016   Isabel LakePT, DPT, LAT, ATC  10/15/16  2:59 PM      [facility] Arlington StreetGLomax NAl[place] [address]Phone: [phone]  Fax:  [phone] Name: Isabel StrakerMRN: [id number] of Birth: [birth year]

## 2016-10-16 ENCOUNTER — Ambulatory Visit: Payer: Self-pay | Admitting: Physical Therapy

## 2016-10-16 ENCOUNTER — Telehealth: Payer: Self-pay | Admitting: Physical Therapy

## 2016-10-16 NOTE — Telephone Encounter (Signed)
Used interpreter to call pt about missing today's session and her daughters session. If she feels she is unable to attend to call and cancel her appointment or it can be rescheduled.

## 2016-10-20 ENCOUNTER — Ambulatory Visit: Payer: Self-pay | Admitting: Physical Therapy

## 2016-10-20 ENCOUNTER — Encounter: Payer: Self-pay | Admitting: Physical Therapy

## 2016-10-20 DIAGNOSIS — M6281 Muscle weakness (generalized): Secondary | ICD-10-CM

## 2016-10-20 DIAGNOSIS — R293 Abnormal posture: Secondary | ICD-10-CM

## 2016-10-20 DIAGNOSIS — G8929 Other chronic pain: Secondary | ICD-10-CM

## 2016-10-20 DIAGNOSIS — M545 Low back pain: Principal | ICD-10-CM

## 2016-10-20 NOTE — Therapy (Signed)
[facility] [address] [place], [place], [address] Phone: [phone]   Fax:  [phone]  Physical Therapy Treatment  Patient Details  Name: Isabel Castillo MRN: [id number] Date of Birth: 1976/01/03 Referring Provider: Annell Greening, MD  Encounter Date: 10/20/2016      PT End of Session - 10/20/16 1145    Visit Number 5   Number of Visits 12   Date for PT Re-Evaluation 11/12/16   PT Start Time 1145   PT Stop Time 1228   PT Time Calculation (min) 43 min   Activity Tolerance Patient tolerated treatment well   Behavior During Therapy [facility] for tasks assessed/performed      History reviewed. No pertinent past medical history.  History reviewed. No pertinent surgical history.  There were no vitals filed for this visit.      Subjective Assessment - 10/20/16 1143    Subjective "today is fine but the other day, I was having pain at 7/10 which was from working"    Pain Score 0-No pain                         [facility] Adult PT Treatment/Exercise - 10/20/16 1148      Lumbar Exercises: Aerobic   Stationary Bike Nu-Step L5 x 5 UE/LE      Lumbar Exercises: Supine   Bent Knee Raise 20 reps;Other (comment)  x 2 with posterior pelvic tilt   Bent Knee Raise Limitations tactile cues for posterior pelvic tilt     Knee/Hip Exercises: Supine   Straight Leg Raises AROM;2 sets;15 reps  with ADIM                PT Education - 10/20/16 1220    Education provided Yes   Education Details posture and lifting mechanics and updated HEp for supine core work   [employer]) Educated Patient   Methods Explanation;Verbal cues;Demonstration   Comprehension Verbalized understanding;Verbal cues required;Returned demonstration          PT Short Term Goals - 10/10/16 1031      PT SHORT TERM GOAL #1   Title Pt will be I with initial and intermediate HEP to assist with LBP pain management and flexiblity with work duties    Time 3   Period Weeks   Status On-going     PT SHORT TERM GOAL #2   Title Pt will demo proper body mechanics lifting 10 lb box for work simulation    Time 3   Period Weeks   Status On-going     PT SHORT TERM GOAL #3   Title Pt will be able to statically stand x 5 min with 50% bil LBP and bil gluteal/Hamstring pain to perform ADLs/work duties   Time 3   Period Weeks   Status On-going           PT Long Term Goals - 10/01/16 1126      PT LONG TERM GOAL #1   Title Pt will demo improved lumbar/core strength to good + to assist with decreased LBP with all activities   Baseline poor +   Time 6   Period Weeks   Status New     PT LONG TERM GOAL #2   Title Pt will demo improved lumbar/LE pain to </= 3/10 after work shift   Baseline 7/10   Time 6   Period Weeks   Status New     PT LONG TERM GOAL #3  Title Pt will be able to use machine at work (where she needs to laterally bend to use) with 75% less pain   Baseline unable   Time 6   Period Weeks   Status New               Plan - 10/20/16 1232    Clinical Impression Statement no pain starting todays visit but pt reported pain the last few days with work. She has been consistnent with her HEP, which she reports no soreness or pain during or following until later at night. continued core strengthening and manual trechniques for the lumbar spine. educated about posture and lifting mechanics. pt reported no pain post session.    PT Next Visit Plan lumbar/core strengthening, endurance training, posture/ lifting mechanics.. possible L posteriorly rotated innominate.    PT Home Exercise Plan hamstring stretch, childs pose with laterals, abdominal draw in with clams, march and heel slides, pelvic titl, posture education/ lifting   Consulted and Agree with Plan of Care Patient      Patient will benefit from skilled therapeutic intervention in order to improve the following deficits and impairments:  Decreased mobility,  Decreased range of motion, Decreased strength, Impaired flexibility, Postural dysfunction, Pain  Visit Diagnosis: Chronic midline low back pain without sciatica  Posture abnormality  Muscle weakness (generalized)     Problem List Patient Active Problem List   Diagnosis Date Noted  . Gastritis and gastroduodenitis 07/24/2016    Lulu RidingLeamon, Conchita Truxillo 10/20/2016, 12:36 PM  [facility] [address] [place], [place], [address] Phone: [phone]   Fax:  [phone]  Name: Isabel Castillo MRN: [id number] Date of Birth: [birth year]

## 2016-10-20 NOTE — Patient Instructions (Addendum)

## 2016-10-20 NOTE — Therapy (Signed)
[facility] [address] [place], [place], [address] Phone: [phone]   Fax:  [phone]  Physical Therapy Treatment  Patient Details  Name: Isabel Castillo MRN: [id number] Date of Birth: [birth year] Referring Provider: Annell GreeningMark Yates, MD  Encounter Date: 10/20/2016      PT End of Session - 10/20/16 1145    Visit Number 5   Number of Visits 12   Date for PT Re-Evaluation 11/12/16   PT Start Time 1145   PT Stop Time 1228   PT Time Calculation (min) 43 min   Activity Tolerance Patient tolerated treatment well   Behavior During Therapy [facility] for tasks assessed/performed      History reviewed. No pertinent past medical history.  History reviewed. No pertinent surgical history.  There were no vitals filed for this visit.      Subjective Assessment - 10/20/16 1143    Subjective "today is fine but the other day, I was having pain at 7/10 which was from working"    Pain Score 0-No pain                         [facility] Adult PT Treatment/Exercise - 10/20/16 1148      Lumbar Exercises: Stretches   Active Hamstring Stretch 2 reps;30 seconds   Pelvic Tilt --  2 x 10 with 5 sec hold   Prone Mid Back Stretch 2 reps;30 seconds  childs pose     Lumbar Exercises: Aerobic   Stationary Bike Nu-Step L5 x 5 UE/LE      Lumbar Exercises: Supine   Bent Knee Raise 20 reps;Other (comment)  x 2 with posterior pelvic tilt   Bent Knee Raise Limitations tactile cues for posterior pelvic tilt   Other Supine Lumbar Exercises table top positoin 5 x 10 sec with ADIM     Knee/Hip Exercises: Supine   Straight Leg Raises AROM;2 sets;15 reps  with ADIM     Manual Therapy   Manual Therapy Myofascial release   Manual therapy comments manual trigger point release over bil lumbar paraspinals   Myofascial Release DTM / rolling over bil lumbar paraspinals                PT Education - 10/20/16 1220    Education provided  Yes   Education Details posture and lifting mechanics and updated HEp for supine core work   [employer]elsPerson([employer]) Educated Patient   Methods Explanation;Verbal cues;Demonstration   Comprehension Verbalized understanding;Verbal cues required;Returned demonstration          PT Short Term Goals - 10/10/16 1031      PT SHORT TERM GOAL #1   Title Pt will be I with initial and intermediate HEP to assist with LBP pain management and flexiblity with work duties   Time 3   Period Weeks   Status On-going     PT SHORT TERM GOAL #2   Title Pt will demo proper body mechanics lifting 10 lb box for work simulation    Time 3   Period Weeks   Status On-going     PT SHORT TERM GOAL #3   Title Pt will be able to statically stand x 5 min with 50% bil LBP and bil gluteal/Hamstring pain to perform ADLs/work duties   Time 3   Period Weeks   Status On-going           PT Long Term Goals - 10/01/16 1126  PT LONG TERM GOAL #1   Title Pt will demo improved lumbar/core strength to good + to assist with decreased LBP with all activities   Baseline poor +   Time 6   Period Weeks   Status New     PT LONG TERM GOAL #2   Title Pt will demo improved lumbar/LE pain to </= 3/10 after work shift   Baseline 7/10   Time 6   Period Weeks   Status New     PT LONG TERM GOAL #3   Title Pt will be able to use machine at work (where she needs to laterally bend to use) with 75% less pain   Baseline unable   Time 6   Period Weeks   Status New               Plan - 10/20/16 1232    Clinical Impression Statement no pain starting todays visit but pt reported pain the last few days with work. She has been consistnent with her HEP, which she reports no soreness or pain during or following until later at night. continued core strengthening and manual trechniques for the lumbar spine. educated about posture and lifting mechanics. pt reported no pain post session.    PT Next Visit Plan lumbar/core  strengthening, endurance training, posture/ lifting mechanics.. possible L posteriorly rotated innominate.    PT Home Exercise Plan hamstring stretch, childs pose with laterals, abdominal draw in with clams, march and heel slides, pelvic titl, posture education/ lifting   Consulted and Agree with Plan of Care Patient      Patient will benefit from skilled therapeutic intervention in order to improve the following deficits and impairments:  Decreased mobility, Decreased range of motion, Decreased strength, Impaired flexibility, Postural dysfunction, Pain  Visit Diagnosis: Chronic midline low back pain without sciatica  Posture abnormality  Muscle weakness (generalized)     Problem List Patient Active Problem List   Diagnosis Date Noted  . Gastritis and gastroduodenitis 07/24/2016   Lulu Riding PT, DPT, LAT, ATC  10/20/16  12:41 PM      [facility] [address] [place], [place], [address] Phone: [phone]   Fax:  [phone]  Name: Isabel Castillo MRN: [id number] Date of Birth: Feb 02, 1976

## 2016-10-24 ENCOUNTER — Encounter: Payer: Self-pay | Admitting: Physical Therapy

## 2016-11-03 ENCOUNTER — Ambulatory Visit: Payer: Self-pay | Admitting: Physical Therapy

## 2016-11-07 ENCOUNTER — Ambulatory Visit: Payer: Self-pay | Admitting: Physical Therapy

## 2016-11-19 ENCOUNTER — Ambulatory Visit: Payer: Self-pay | Attending: Orthopaedic Surgery | Admitting: Physical Therapy

## 2016-11-19 ENCOUNTER — Encounter: Payer: Self-pay | Admitting: Physical Therapy

## 2016-11-19 DIAGNOSIS — M6281 Muscle weakness (generalized): Secondary | ICD-10-CM | POA: Insufficient documentation

## 2016-11-19 DIAGNOSIS — R293 Abnormal posture: Secondary | ICD-10-CM | POA: Insufficient documentation

## 2016-11-19 DIAGNOSIS — M545 Low back pain: Secondary | ICD-10-CM | POA: Insufficient documentation

## 2016-11-19 DIAGNOSIS — G8929 Other chronic pain: Secondary | ICD-10-CM | POA: Insufficient documentation

## 2016-11-19 NOTE — Therapy (Signed)
[facility] [address] [place], [place], [address] Phone: [phone]   Fax:  [phone]  Physical Therapy Treatment  Patient Details  Name: Isabel Castillo MRN: [id number] Date of Birth: [birth year] Referring Provider: Annell Greening, MD  Encounter Date: 11/19/2016      PT End of Session - 11/19/16 0938    Visit Number 6   Number of Visits 12   Date for PT Re-Evaluation 11/12/16   PT Start Time 0946   PT Stop Time 1032   PT Time Calculation (min) 46 min   Activity Tolerance Patient tolerated treatment well   Behavior During Therapy [facility] for tasks assessed/performed      History reviewed. No pertinent past medical history.  History reviewed. No pertinent surgical history.  There were no vitals filed for this visit.      Subjective Assessment - 11/19/16 0852    Subjective No pain now.  No longer with leg pain.  Pain in low back, centralized.  Intermittant.  I changed jobs 2 weeks ago where I was standing 8 hours in the same place.  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     Patient is accompained by: Interpreter  [employer]   Currently in Pain? No/denies   Pain Score 7    Pain Location Back   Pain Orientation Right;Left;Mid   Pain Descriptors / Indicators --  not much pain   Pain Radiating Towards no   Pain Frequency Several days a week   Aggravating Factors  sitting too long then getting up   Pain Relieving Factors Stretching.  Change of position                         [facility] Adult PT Treatment/Exercise - 11/19/16 0001      Self-Care   Self-Care --  ed on pars defect,  chart model used,  questions andwered,       Lumbar Exercises: Stretches   Passive Hamstring Stretch 3 reps;30 seconds  both with strap   Lower Trunk Rotation 2 reps;20 seconds   Pelvic Tilt 3 reps   Pelvic Tilt Limitations cues   Quad Stretch 2 reps;30 seconds   Quad Stretch Limitations concurrent with hip flex stretch over edge      Lumbar Exercises: Aerobic   Stationary Bike Nu-Step L5 x 5 UE/LE      Lumbar Exercises: Supine   Bent Knee Raise 10 reps   Bent Knee Raise Limitations had her monitor p   Bridge 10 reps     Modalities   Modalities Moist Heat     Moist Heat Therapy   Moist Heat Location --  IT band, concurrent with manual and PROM to same     Manual Therapy   Manual therapy comments ITband manual.  tennis ball issued.  PROMstretch to lateral hip musculature.  Tissue softened.  ROM improved post stretching lateral hip.                  PT Education - 11/19/16 [phone]    Education provided Yes   Education Details PARS defect     Person(s) Educated Patient   Methods Explanation;Demonstration;Verbal cues   Comprehension Verbalized understanding          PT Short Term Goals - 11/19/16 0941      PT SHORT TERM GOAL #1   Title Pt will be I with initial and intermediate HEP to assist with LBP pain  management and flexiblity with work duties   Baseline independent with exercises issued so far   Time 3   Period Weeks   Status On-going     PT SHORT TERM GOAL #2   Title Pt will demo proper body mechanics lifting 10 lb box for work simulation    Time 3   Period Weeks   Status Unable to assess     PT SHORT TERM GOAL #3   Title Pt will be able to statically stand x 5 min with 50% bil LBP and bil gluteal/Hamstring pain to perform ADLs/work duties   Time 3   Period Weeks   Status Unable to assess           PT Long Term Goals - 10/01/16 1126      PT LONG TERM GOAL #1   Title Pt will demo improved lumbar/core strength to good + to assist with decreased LBP with all activities   Baseline poor +   Time 6   Period Weeks   Status New     PT LONG TERM GOAL #2   Title Pt will demo improved lumbar/LE pain to </= 3/10 after work shift   Baseline 7/10   Time 6   Period Weeks   Status New     PT LONG TERM GOAL #3   Title Pt will be able to use machine at work (where she needs to  laterally bend to use) with 75% less pain   Baseline unable   Time 6   Period Weeks   Status New               Plan - 11/19/16 [id number]    Clinical Impression Statement No pain today.  Pain now several days a week vs constant.  IT band / lateral hip tight.  Patient is consistant with HEP.    PT Treatment/Interventions ADLs/Self Care Home Management;Cryotherapy;Electrical Stimulation;Iontophoresis 4mg /ml Dexamethasone;Functional mobility training;Ultrasound;Moist Heat;Therapeutic activities;Therapeutic exercise;Patient/family education;Manual techniques;Dry needling;Taping   PT Next Visit Plan lumbar/core strengthening, endurance training, posture/ lifting mechanics.. possible L posteriorly rotated innominate. Add IT band stretch to HEP.    PT Home Exercise Plan hamstring stretch, childs pose with laterals, abdominal draw in with clams, march and heel slides, pelvic titl, posture education/ lifting   Consulted and Agree with Plan of Care Patient      Patient will benefit from skilled therapeutic intervention in order to improve the following deficits and impairments:     Visit Diagnosis: Chronic midline low back pain without sciatica  Posture abnormality  Muscle weakness (generalized)     Problem List Patient Active Problem List   Diagnosis Date Noted  . Gastritis and gastroduodenitis 07/24/2016    Rinaldo Macqueen  PTA 11/19/2016, 9:42 AM  [facility] [address] [place], [place], [address] Phone: [phone]   Fax:  [phone]  Name: Isabel Castillo MRN: [id number] Date of Birth: [birth year]

## 2016-11-25 ENCOUNTER — Encounter: Payer: Self-pay | Admitting: Physical Therapy

## 2016-11-25 ENCOUNTER — Ambulatory Visit: Payer: Self-pay | Admitting: Physical Therapy

## 2016-11-25 DIAGNOSIS — M545 Low back pain: Principal | ICD-10-CM

## 2016-11-25 DIAGNOSIS — R293 Abnormal posture: Secondary | ICD-10-CM

## 2016-11-25 DIAGNOSIS — M6281 Muscle weakness (generalized): Secondary | ICD-10-CM

## 2016-11-25 DIAGNOSIS — G8929 Other chronic pain: Secondary | ICD-10-CM

## 2016-11-25 NOTE — Therapy (Addendum)
[place], [place], [address] Phone: [phone]   Fax:  [phone]  Physical Therapy Treatment/ Discharge Summary  Patient Details  Name: Isabel Castillo MRN: [id number] Date of Birth: 20-Mar-1975 Referring Provider: Rodell Perna, MD  Encounter Date: 11/25/2016      PT End of Session - 11/25/16 0952    Visit Number 7   Number of Visits 12   Date for PT Re-Evaluation 11/12/16   PT Start Time 6237   PT Stop Time 0942   PT Time Calculation (min) 45 min   Activity Tolerance Patient tolerated treatment well   Behavior During Therapy Isabel Castillo [facility] for tasks assessed/performed      History reviewed. No pertinent past medical history.  History reviewed. No pertinent surgical history.  There were no vitals filed for this visit.      Subjective Assessment - 11/25/16 0859    Subjective Pain got worse in low back after last session lasting through next day.    She does not have leg pain now  She is able to walk now with no increased pain.  Not exercising at home.  Wants to try ZUMBA.    Currently in Pain? Yes   Pain Score 6    Pain Location Back   Pain Orientation Right   Pain Descriptors / Indicators Sharp   Pain Type Chronic pain   Pain Radiating Towards No   Pain Frequency Intermittent   Aggravating Factors  sitting on hard surfaces and on floor    Pain Relieving Factors change of position                         [facility] Adult PT Treatment/Exercise - 11/25/16 0001      Therapeutic Activites    Therapeutic Activities Lifting   Lifting 10 LB box education on lifting from chair height and from floor.  Golfer's lift practiced and techniques for laundry simulated.  Patient able to do correctly after instruction.     Lumbar Exercises: Stretches   Active Hamstring Stretch 30 seconds;2 reps   Active Hamstring Stretch Limitations good ROM.     Pelvic Tilt 5 reps  2 sets   Prone Mid Back Stretch  2 reps;30 seconds  childs pose     Lumbar Exercises: Aerobic   Stationary Bike Nu-Step L5 x 6 UE/LE      Lumbar Exercises: Machines for Strengthening   Leg Press 10 X 2 sets. 1, 2 plates,  cued initially     Lumbar Exercises: Sidelying   Clam 10 reps   Clam Limitations cues initially each     Lumbar Exercises: Prone   Single Arm Raise 10 reps   Single Arm Raise Weights (lbs) shoulders stiff end range   Straight Leg Raise 10 reps   Opposite Arm/Leg Raise 10 reps   Opposite Arm/Leg Raise Limitations cues     Knee/Hip Exercises: Stretches   Gastroc Stretch 3 reps;30 seconds   Gastroc Stretch Limitations slant board     Knee/Hip Exercises: Supine   Bridges Limitations 10 x small lifts                PT Education - 11/25/16 0948    Education provided Yes   Education Details ADL   Person(s) Educated Patient   Methods Explanation;Demonstration;Tactile cues;Verbal cues;Handout   Comprehension Verbalized understanding;Returned demonstration          PT Short Term Goals - 11/25/16 6283  PT SHORT TERM GOAL #1   Title Pt will be I with initial and intermediate HEP to assist with LBP pain management and flexiblity with work duties   Baseline independent with exercises issued so far, however she is non compliant   Time 3   Period Weeks   Status On-going     PT SHORT TERM GOAL #2   Title Pt will demo proper body mechanics lifting 10 lb box for work simulation    Baseline able   Time 3   Period Weeks   Status Achieved     PT SHORT TERM GOAL #3   Title Pt will be able to statically stand x 5 min with 50% bil LBP and bil gluteal/Hamstring pain to perform ADLs/work duties   Time 3   Period Weeks   Status Unable to assess           PT Long Term Goals - 10/01/16 1126      PT LONG TERM GOAL #1   Title Pt will demo improved lumbar/core strength to good + to assist with decreased LBP with all activities   Baseline poor +   Time 6   Period Weeks   Status  New     PT LONG TERM GOAL #2   Title Pt will demo improved lumbar/LE pain to </= 3/10 after work shift   Baseline 7/10   Time 6   Period Weeks   Status New     PT LONG TERM GOAL #3   Title Pt will be able to use machine at work (where she needs to laterally bend to use) with 75% less pain   Baseline unable   Time 6   Period Weeks   Status New               Plan - 11/25/16 4128    Clinical Impression Statement No pain today.  STG#2 met.  Pain intermittant and now in right low back.  Area of pain continues to decrease.  Patient able to demonstrate good lifting/ lowering posture after instruction.  Patient fatigues with exercise.  She is to expect muscle soreness after exercise.   PT Treatment/Interventions ADLs/Self Care Home Management;Cryotherapy;Electrical Stimulation;Iontophoresis 53m/ml Dexamethasone;Functional mobility training;Ultrasound;Moist Heat;Therapeutic activities;Therapeutic exercise;Patient/family education;Manual techniques;Dry needling;Taping   PT Next Visit Plan lumbar/core strengthening, endurance training, review posture/ lifting mechanics.. possible L posteriorly rotated innominate. Add IT band stretch to HEP.    PT Home Exercise Plan hamstring stretch, childs pose with laterals, abdominal draw in with clams, march and heel slides, pelvic titl, posture education/ lifting   Consulted and Agree with Plan of Care Patient      Patient will benefit from skilled therapeutic intervention in order to improve the following deficits and impairments:     Visit Diagnosis: Chronic midline low back pain without sciatica  Posture abnormality  Muscle weakness (generalized)     Problem List Patient Active Problem List   Diagnosis Date Noted  . Gastritis and gastroduodenitis 07/24/2016    Castillo,Isabel PTA 11/25/2016, 9:57 AM  [facility] [place] [address]Phone: [phone]  Fax:   [phone] Name: MKimberlea SchlagMRN: [id number] of Birth: 804-24-1977   PHYSICAL THERAPY DISCHARGE SUMMARY  Visits from Start of Care: 7  Current functional level related to goals / functional outcomes: See goals   Remaining deficits: unknown   Education / Equipment: HEP, posture,  Plan: Patient agrees to discharge.  Patient goals  were not met. Patient is being discharged due to meeting the stated rehab goals.  ?????     Kristoffer Leamon PT, DPT, LAT, ATC  01/26/17  8:39 AM

## 2016-11-25 NOTE — Patient Instructions (Signed)
Deep Squat    Pngase en cuclillas y levntese sosteniendo el objeto con ambas manos contra el pecho. Contraiga los msculos del estmago sin contener la respiracin. Realice movimientos suaves para evitar tironeos.  Copyright  VHI. All rights reserved.  Car [employer]unk - Reaching Down    Mantenga la curvatura lumbar cuando trate de alcanzar algo en un bal profundo. Tambin puede apoyarse en Edison Simonuna mano y levantar la pierna opuesta para mantener la espalda derecha.   Copyright  VHI. All rights reserved.

## 2016-11-26 ENCOUNTER — Ambulatory Visit: Payer: Self-pay | Admitting: Physical Therapy

## 2016-11-29 ENCOUNTER — Emergency Department (HOSPITAL_COMMUNITY)
Admission: EM | Admit: 2016-11-29 | Discharge: 2016-11-30 | Disposition: A | Discharge status: Home / Self Care | Payer: Self-pay | Attending: Emergency Medicine | Admitting: Emergency Medicine

## 2016-11-29 ENCOUNTER — Emergency Department (HOSPITAL_COMMUNITY): Payer: Self-pay

## 2016-11-29 ENCOUNTER — Encounter (HOSPITAL_COMMUNITY): Payer: Self-pay | Admitting: Emergency Medicine

## 2016-11-29 DIAGNOSIS — Y929 Unspecified place or not applicable: Secondary | ICD-10-CM | POA: Insufficient documentation

## 2016-11-29 DIAGNOSIS — Y9389 Activity, other specified: Secondary | ICD-10-CM | POA: Insufficient documentation

## 2016-11-29 DIAGNOSIS — Z79899 Other long term (current) drug therapy: Secondary | ICD-10-CM | POA: Insufficient documentation

## 2016-11-29 DIAGNOSIS — W010XXA Fall on same level from slipping, tripping and stumbling without subsequent striking against object, initial encounter: Secondary | ICD-10-CM | POA: Insufficient documentation

## 2016-11-29 DIAGNOSIS — W19XXXA Unspecified fall, initial encounter: Secondary | ICD-10-CM

## 2016-11-29 DIAGNOSIS — Y999 Unspecified external cause status: Secondary | ICD-10-CM | POA: Insufficient documentation

## 2016-11-29 DIAGNOSIS — S0990XA Unspecified injury of head, initial encounter: Secondary | ICD-10-CM | POA: Insufficient documentation

## 2016-11-29 LAB — POC URINE PREG, ED: PREG TEST UR: NEGATIVE

## 2016-11-29 MED ORDER — ACETAMINOPHEN 500 MG PO TABS
1000.0000 mg | ORAL_TABLET | Freq: Once | ORAL | Status: AC
  Administered 2016-11-29: 1000 mg via ORAL
  Filled 2016-11-29: qty 2

## 2016-11-29 NOTE — ED Provider Notes (Signed)
[facility]-EMERGENCY DEPT Provider Note   CSN: [id number] Arrival date & time: 11/29/16  2044     History   Chief Complaint Chief Complaint  Patient presents with  . Fall  . Head Injury    HPI Adellyn Capek Doffing is a 41 y.o. female presenting with head pain after falling.  Patient states she was drinking when she slipped and fell. She fell backwards and hit her head. She denies loss of consciousness. She is not on blood thinners. She reports continued pain to the back of her head. She denies pain elsewhere. She denies vision changes, slurred speech, decreased concentration, chest pain, shortness of breath, nausea, vomiting, abdominal pain, urinary symptoms, abnormal bowel movements. She has not lost control of her bowel or bladder. She denies neck or back pain. She denies numbness or tingling. She states she does not know how much she was drinking, but she thinks she was drinking tequila. She denies other drug use.    HPI  History reviewed. No pertinent past medical history.  Patient Active Problem List   Diagnosis Date Noted  . Gastritis and gastroduodenitis 07/24/2016    History reviewed. No pertinent surgical history.  OB History    No data available       Home Medications    Prior to Admission medications   Medication Sig Start Date End Date Taking? Authorizing Provider  citalopram (CELEXA) 20 MG tablet Celexa 20 mg tablet  Take 1 tablet every day by oral route in the morning.    [provider]  clotrimazole (LOTRIMIN) 1 % cream Apply 1 application topically 2 (two) times daily. 09/09/16   Loletta Specter, PA-C  lovastatin (MEVACOR) 20 MG tablet Take 1 tablet (20 mg total) by mouth at bedtime. 09/19/16   Loletta Specter, PA-C  naproxen (NAPROSYN) 500 MG tablet Take 1 tablet (500 mg total) by mouth 2 (two) times daily with a meal. 07/24/16   Loletta Specter, PA-C  omeprazole (PRILOSEC) 40 MG capsule Take 1 capsule (40 mg total) by mouth daily. 07/24/16    Loletta Specter, PA-C  triamcinolone cream (KENALOG) 0.5 % Apply 1 application topically 2 (two) times daily. Do not use for more than 7 consecutive days without medical approval. 10/01/16   Loletta Specter, PA-C    Family History No family history on file.  Social History Social History  Substance Use Topics  . Smoking status: Never Smoker  . Smokeless tobacco: Never Used  . Alcohol use Not on file     Allergies   Patient has no allergy information on record.   Review of Systems Review of Systems  HENT: Negative for facial swelling.        Hematoma to the occiput  Eyes: Negative for visual disturbance.  Respiratory: Negative for cough, chest tightness and shortness of breath.   Cardiovascular: Negative for chest pain.  Gastrointestinal: Negative for abdominal pain, constipation, diarrhea, nausea and vomiting.  Genitourinary: Negative for dysuria, frequency and hematuria.  Musculoskeletal: Negative for back pain and neck pain.  Skin: Negative for wound.  Neurological: Positive for headaches. Negative for dizziness, speech difficulty, light-headedness and numbness.  Hematological: Does not bruise/bleed easily.  Psychiatric/Behavioral: Negative for confusion.     Physical Exam Updated Vital Signs BP (!) 92/58 (BP Location: Left Arm)   Pulse (!) 104   Temp 98.7 F (37.1 C)   Resp 16   SpO2 98%   Physical Exam  Constitutional: She is oriented to person, place, and time.  She appears well-developed and well-nourished. No distress.  HENT:  Head: Normocephalic.  Nose: Nose normal.  Mouth/Throat: Uvula is midline, oropharynx is clear and moist and mucous membranes are normal.  Patient with hematoma to the occiput. No active bleeding. No tenderness, lesion, or injury noted elsewhere on the scalp.  Eyes: Pupils are equal, round, and reactive to light. EOM are normal.  Neck:  No tenderness to palpation of the midline c-spine. Patient in c-collar  Cardiovascular:  Normal rate, regular rhythm and intact distal pulses.   Pulmonary/Chest: Effort normal and breath sounds normal. She exhibits no tenderness.  No obvious injury to the thorax. No tenderness to palpation of the chest wall.  Abdominal: Soft. She exhibits no distension. There is no tenderness. There is no guarding.  No tenderness to palpation of the abdomen  Musculoskeletal: Normal range of motion. She exhibits no tenderness or deformity.  Moves all extremities on command. Strength intact x4. Sensation intact 4. No obvious injury or deformity. Patient is ambulatory without difficulty.  Neurological: She is alert and oriented to person, place, and time. She has normal strength. No cranial nerve deficit or sensory deficit. Gait normal. GCS eye subscore is 4. GCS verbal subscore is 5. GCS motor subscore is 6.  Skin: Skin is warm.  Psychiatric: She has a normal mood and affect.  Nursing note and vitals reviewed.    ED Treatments / Results  Labs (all labs ordered are listed, but only abnormal results are displayed) Labs Reviewed  POC URINE PREG, ED    EKG  EKG Interpretation None       Radiology Ct Head Wo Contrast  Result Date: 11/29/2016 CLINICAL DATA:  Patient fell resulting in unresponsive episode. EXAM: CT HEAD WITHOUT CONTRAST; CT CERVICAL SPINE WITHOUT CONTRAST TECHNIQUE: Contiguous axial images were obtained from the base of the skull through the vertex without intravenous contrast. COMPARISON:  11/04/2008 head CT FINDINGS: CT HEAD FINDINGS BRAIN: The ventricles and sulci are normal. No intraparenchymal hemorrhage, mass effect nor midline shift. No acute large vascular territory infarcts. No abnormal extra-axial fluid collections. Basal cisterns are midline and not effaced. No acute cerebellar abnormality. VASCULAR: Unremarkable. SKULL/SOFT TISSUES: No skull fracture. Mild occipital scalp contusion and swelling. ORBITS/SINUSES: The included ocular globes and orbital contents are  normal.The mastoid air-cells and included paranasal sinuses are well-aerated. OTHER: None. CT CERVICAL SPINE FINDINGS ALIGNMENT: Vertebral bodies in alignment. Maintained lordosis. SKULL BASE AND VERTEBRAE: Cervical vertebral bodies and posterior elements are intact. Intervertebral disc heights preserved. No destructive bony lesions. C1-2 articulation maintained. Nuchal ligament ossification noted posterior to the T1 spinous process. SOFT TISSUES AND SPINAL CANAL: Normal. DISC LEVELS: No significant osseous canal stenosis or neural foraminal narrowing. UPPER CHEST: Lung apices are clear. OTHER: None. IMPRESSION: 1. No acute intracranial nor cervical spine abnormality. 2. Mild occipital scalp contusion and soft tissue swelling. Electronically Signed   By: Tollie Eth M.D.   On: 11/29/2016 22:58   Ct Cervical Spine Wo Contrast  Result Date: 11/29/2016 CLINICAL DATA:  Patient fell resulting in unresponsive episode. EXAM: CT HEAD WITHOUT CONTRAST; CT CERVICAL SPINE WITHOUT CONTRAST TECHNIQUE: Contiguous axial images were obtained from the base of the skull through the vertex without intravenous contrast. COMPARISON:  11/04/2008 head CT FINDINGS: CT HEAD FINDINGS BRAIN: The ventricles and sulci are normal. No intraparenchymal hemorrhage, mass effect nor midline shift. No acute large vascular territory infarcts. No abnormal extra-axial fluid collections. Basal cisterns are midline and not effaced. No acute cerebellar abnormality. VASCULAR: Unremarkable. SKULL/SOFT TISSUES:  No skull fracture. Mild occipital scalp contusion and swelling. ORBITS/SINUSES: The included ocular globes and orbital contents are normal.The mastoid air-cells and included paranasal sinuses are well-aerated. OTHER: None. CT CERVICAL SPINE FINDINGS ALIGNMENT: Vertebral bodies in alignment. Maintained lordosis. SKULL BASE AND VERTEBRAE: Cervical vertebral bodies and posterior elements are intact. Intervertebral disc heights preserved. No destructive  bony lesions. C1-2 articulation maintained. Nuchal ligament ossification noted posterior to the T1 spinous process. SOFT TISSUES AND SPINAL CANAL: Normal. DISC LEVELS: No significant osseous canal stenosis or neural foraminal narrowing. UPPER CHEST: Lung apices are clear. OTHER: None. IMPRESSION: 1. No acute intracranial nor cervical spine abnormality. 2. Mild occipital scalp contusion and soft tissue swelling. Electronically Signed   By: Tollie Eth M.D.   On: 11/29/2016 22:58    Procedures Procedures (including critical care time)  Medications Ordered in ED Medications  acetaminophen (TYLENOL) tablet 1,000 mg (1,000 mg Oral Given 11/29/16 2245)     Initial Impression / Assessment and Plan / ED Course  I have reviewed the triage vital signs and the nursing notes.  Pertinent labs & imaging results that were available during my care of the patient were reviewed by me and considered in my medical decision making (see chart for details).     Patient presenting with head pain after mechanical fall after drinking. Physical exam reassuring, as patient without obvious neurologic deficit. As patient is inebriated, will order CT head and neck. No obvious injury elsewhere.  CT head and neck negative for fracture or bleed. C-collar removed, and patient continues to report pain only in the occiput. Will give tylenol for pain. Discussed findings with patient. Patient appears safe for discharge. Strict return precautions given. Patient and family state they understand and agree to plan.  Final Clinical Impressions(s) / ED Diagnoses   Final diagnoses:  Injury of head, initial encounter  Fall, initial encounter    New Prescriptions Discharge Medication List as of 11/29/2016 11:14 PM       Alveria Apley, PA-C 11/30/16 4098    Mancel Bale, MD 11/30/16 [phone]

## 2016-11-29 NOTE — ED Triage Notes (Signed)
Pt BIB [facility] for fall that resulted in unresponsive. Pt has been drinking and dancing slipping striking the back of her head. Pt is having repetitive questioning. Pt was ambulatory on scene per EMS. Pt coming from home

## 2016-11-29 NOTE — Discharge Instructions (Signed)
Toma ibuprofen 800 3 veces cada dia con comida o tylenol 1000 mg 3 veces cada dia por el dolor.  [place] hielo para ayudar con hichazo o dolor [place] cita con el doctor primaria si el dolor de cabeza no mejora en [place].  Regresa a emergencia si vomita sin parar, tiene cambio de vision, si hay cambio de sensacion, o si hay nueveas sintomas.   Take ibuprofen 800 mg 3 times a day with meals or Tylenol 1000 mg 3 times a day for pain. Use ice to help with swelling and pain. Make an appointment with your primary care doctor in 1 week if headache is not improving. Return to the emergency room if you have persistent vomiting, vision changes, numbness or tingling, or any new or worsening symptoms.

## 2016-11-29 NOTE — ED Notes (Signed)
Patient transported to CT 

## 2016-11-29 NOTE — ED Notes (Signed)
C- Collar placed since patient hit head

## 2016-11-30 NOTE — ED Notes (Signed)
Pt and family understood dc material. NAD noted 

## 2016-12-08 ENCOUNTER — Ambulatory Visit: Payer: Self-pay | Admitting: Physical Therapy

## 2016-12-19 ENCOUNTER — Encounter (INDEPENDENT_AMBULATORY_CARE_PROVIDER_SITE_OTHER): Payer: Self-pay | Admitting: Physician Assistant

## 2016-12-19 ENCOUNTER — Ambulatory Visit (INDEPENDENT_AMBULATORY_CARE_PROVIDER_SITE_OTHER): Payer: Self-pay | Admitting: Physician Assistant

## 2016-12-19 VITALS — BP 116/73 | HR 72 | Temp 98.2°F | Wt 172.0 lb

## 2016-12-19 DIAGNOSIS — E669 Obesity, unspecified: Secondary | ICD-10-CM

## 2016-12-19 DIAGNOSIS — Z8659 Personal history of other mental and behavioral disorders: Secondary | ICD-10-CM

## 2016-12-19 DIAGNOSIS — Z6830 Body mass index (BMI) 30.0-30.9, adult: Secondary | ICD-10-CM

## 2016-12-19 MED ORDER — ESCITALOPRAM OXALATE 10 MG PO TABS: 10.0000 mg | ORAL_TABLET | Freq: Every day | ORAL | 5 refills | Status: DC

## 2016-12-19 NOTE — Patient Instructions (Addendum)
Citalopram tablets Qu es este medicamento? El CITALOPRAM es un medicamento para la depresin. Este medicamento puede ser utilizado para otros usos; si tiene alguna pregunta consulte con su proveedor de atencin mdica o con su farmacutico. MARCAS COMUNES: Celexa Qu le debo informar a mi profesional de la salud antes de tomar este medicamento? Necesitan saber si usted presenta alguno de los siguientes problemas o situaciones: trastornos de sangrado trastorno bipolar o antecedentes familiares de trastorno bipolar glaucoma enfermedad cardiaca antecedentes de ritmo cardiaco irregular enfermedad renal enfermedad heptica bajos niveles de magnesio o potasio en la sangre si recibi terapia electroconvulsiva convulsiones ideas, planes o intento de suicidio; si usted o alguien de su familia ha intentado suicidarse previamente si toma medicamentos que tratan o previenen cogulos sanguneos enfermedad tiroidea una reaccin alrgica o inusual al citalopram, al escitalopram, a otros medicamentos, alimentos, colorantes o conservantes si est embarazada o buscando quedar embarazada si est amamantando a un beb Cmo debo utilizar este medicamento? Tome este medicamento por va oral con un vaso de agua. Siga las instrucciones de la etiqueta del [place]. Puede tomarlo con o sin alimentos. Tome su medicamento a intervalos regulares. No tome su medicamento con una frecuencia mayor a la indicada. No deje de tomar [employer] de repente a menos que as lo indique su mdico. Dejar de Visual merchandiser medicamento demasiado rpido puede causar efectos secundarios graves o podra empeorar su afeccin. Su farmacutico le dar una Gua del medicamento especial (MedGuide, nombre en ingls) con cada receta y en cada ocasin que la vuelva a surtir. Asegrese de leer esta informacin cada vez cuidadosamente. Hable con su pediatra para informarse acerca del uso de este medicamento en nios. Puede requerir Public house manager. Los pacientes de ms de 60 aos de edad pueden presentar reacciones ms fuertes y Pension scheme manager dosis [employer]. Sobredosis: Pngase en contacto inmediatamente con un centro toxicolgico o una sala de urgencia si usted cree que haya tomado demasiado medicamento. ATENCIN: [employer] es solo para usted. No comparta este medicamento con nadie. Qu sucede si me olvido de una dosis? Si olvida una dosis, tmela lo antes posible. Si es casi la hora de la prxima dosis, tome slo esa dosis. No tome dosis adicionales o dobles. Qu puede interactuar con este medicamento? No tome este medicamento con ninguno de los siguientes frmacos: ciertos medicamentos para infecciones micticas, tales como fluconazol, itraconazol, ketoconazol, posaconazol y voriconazol cisaprida dofetilida dronedarona escitalopram linezolida IMAO, tales como [place], Eldepryl, [place], [place] y Parnate azul de metileno (inyectado en una vena) pimozida tioridazina ziprasidona Este medicamento tambin puede interactuar con los siguientes medicamentos: alcohol anfetaminas aspirina y medicamentos tipo aspirina carbamazepina ciertos medicamentos para la depresin, ansiedad o trastornos psicticos ciertos medicamentos para infecciones, tales como cloroquina, Air cabin crew, eritromicina, furazolidona, isoniazida, pentamidina ciertos medicamentos para la migraa, tales como almotriptn, eletriptn, frovatriptn, naratriptn, rizatriptn, sumatriptn y zolmitriptn ciertos medicamentos para conciliar el sueo ciertos medicamentos que tratan o previenen cogulos sanguneos, tales como dalteparina, enoxaparina y warfarina cimetidina diurticos fentanilo litio metadona metoprolol AINE, medicamentos para Chief Technology Officer y la inflamacin, como ibuprofeno o naproxeno omeprazol otros medicamentos que prolongan el intervalo QT (causan un ritmo cardiaco anormal) procarbazina rasagilina suplementos tales como hierba de [place], kava kava y valeriana  tramadol triptfano Puede ser que esta lista no menciona todas las posibles interacciones. Informe a su profesional de [employer] de [employer] productos a base de hierbas, medicamentos de [place] o suplementos nutritivos que est tomando. Si usted fuma, consume bebidas alcohlicas o si [place] drogas  ilegales, indqueselo tambin a su profesional de [employer]. Algunas sustancias pueden interactuar con su medicamento. A qu debo estar atento al usar [employer]? Informe a su mdico si sus sntomas no mejoran o si empeoran. Visite a su mdico o a su profesional de la salud para chequear su evolucin peridicamente. Debido que puede ser necesario tomar este medicamento durante varias semanas para que sea posible observar sus efectos en forma [place], es importante que sigue su tratamiento como recetado por su mdico. Los pacientes y sus familias deben estar atentos si empeora la depresin o ideas suicidas. Tambin est atento a cambios repentinos o severos de emocin, tales como el sentirse ansioso, agitado, lleno de pnico, irritable, hostil, agresivo, impulsivo, inquietud severa, demasiado excitado y hiperactivo o dificultad para conciliar el sueo. Si esto ocurre, especialmente al comenzar con el tratamiento o al cambiar de dosis, comunquese con su profesional de [employer]. Puede experimentar somnolencia o mareos. No conduzca ni utilice maquinaria, ni haga nada que Scientist, research (life sciences) en estado de alerta hasta que sepa cmo le afecta este medicamento. No se siente ni se ponga de pie con rapidez, especialmente si es un paciente de edad avanzada. Esto reduce el riesgo de mareos o [employer]. El alcohol puede interferir con el efecto de [place]. Evite consumir bebidas alcohlicas. Se le podr secar la boca. Masticar chicle sin azcar, chupar caramelos duros y beber agua en abundancia le ayudar a mantener la boca hmeda. Qu efectos secundarios puedo tener al [employer] este medicamento? Efectos  secundarios que debe informar a su mdico o a Producer, television/film/video de la salud tan pronto como sea posible: Therapist, art, como erupcin cutnea, comezn/picazn o urticarias, e hinchazn de la cara, los labios o la lengua ansiedad heces de color negro y aspecto alquitranado problemas respiratorios cambios en la visin dolor en el pecho confusin estado de nimo elevado, menor necesidad de dormir, pensamientos acelerados, conducta impulsiva dolor ocular ritmo cardiaco rpido, irregular sensacin de desmayos o aturdimiento, cadas sensacin de agitacin, enojo o irritabilidad alucinaciones, prdida del contacto con la realidad prdida de equilibrio o coordinacin prdida de memoria ereccin dolorosa o prolongada inquietud, caminar de un lado a otro, incapacidad para quedarse quieto convulsiones rigidez de los [employer] Corporation ideas suicidas u otros cambios en el estado de nimo dificultad para conciliar el sueo sangrado o moretones inusuales cansancio o debilidad inusual vmito Efectos secundarios que generalmente no requieren atencin mdica (infrmelos a su mdico o a Producer, television/film/video de la salud si persisten o si son molestos): cambios en el apetito o el peso cambios en el deseo o desempeo sexual mareos dolor de cabeza aumento de la sudoracin indigestin, nuseas temblores Puede ser que esta lista no menciona todos los posibles efectos secundarios. Comunquese a su mdico por asesoramiento mdico [employer]. Usted puede informar los efectos secundarios a la FDA por telfono al 1-800-FDA-1088. Dnde debo guardar mi medicina? Mantngala fuera del alcance de los nios. Gurdela a [employer], entre 15 y 30 grados C (76 y 22 grados F). Deseche todo el medicamento que no haya utilizado, despus de la fecha de vencimiento. ATENCIN: Este folleto es un resumen. Puede ser que no cubra toda la posible informacin. Si usted tiene preguntas acerca de esta medicina, consulte con su mdico, su  farmacutico o su profesional de Radiographer, therapeutic.  2018 Elsevier/Gold Standard (2016-04-03 00:00:00) Phentermine tablets or capsules Qu es este medicamento? La FENTERMINA reduce su apetito. Si la combina con una dieta de bajas caloras y ejercio puede  ayudarle a bajar de peso. Este medicamento puede ser utilizado para otros usos; si tiene alguna pregunta consulte con su proveedor de atencin mdica o con su farmacutico. MARCAS COMUNES: Adipex-P, Atti-Plex P, Atti-Plex P Spansule, Fastin, [place]om[place]a, Pro-Fast, Tara-8 [employer] debo informar a mi profesional de la salud antes de tomar este medicamento? Necesita saber si usted presenta alguno de los siguientes problemas o situaciones: -agitacin -glaucoma -enfermedad cardiaca -alta presin sangunea -antecedentes de abuso de substancias -enfermedad pulmonar llamada Hipertensin Pulmonar Primaria -si ha tomado un IMAO, tales como Carbex, Eldepryl, Marplan, Nardil o Parnate en los ltimos 14 das -enfermedad tiroidea -una reaccin alrgica o inusual a la fentermina, a otros medicamentos, alimentos, colorantes o conservantes -si est embarazada o buscando quedar embarazada -si est amamantando a un beb Cmo debo utilizar este medicamento? Tome este medicamento por va oral con un vaso de agua. Siga las instrucciones de la etiqueta del [place]. Las instrucciones de uso pueden variar segn el producto y la dosis que est tomando. Evite tomar este medicamento por la tarde antes de acostarse a dormir. Puede interferir con la conciliacin del sueo. Tome sus dosis a intervalos regulares. No tome su medicamento con una frecuencia mayor a la indicada. Hable con su pediatra para informarse acerca del uso de este medicamento en nios. Aunque este medicamento se puede recetar a nios a Glass blower/designer de los 17 aos de edad en casos selectos, existen precauciones que deben cumplirse. Sobredosis: Pngase en contacto inmediatamente con un centro toxicolgico o una sala de  urgencia si usted cree que haya tomado demasiado medicamento. ATENCIN: [employer] es solo para usted. No comparta este medicamento con nadie. Qu sucede si me olvido de una dosis? Si olvida una dosis, tmela lo antes posible. Si es casi la hora de la prxima dosis, tome slo esa dosis. No tome dosis adicionales o dobles. Qu puede interactuar con este medicamento? No tome esta medicina con ninguno de los siguientes medicamentos: -duloxetina -IMAOs, tales como Carbex, Eldepryl, Marplan, Nardil y Parnate -medicamentos para resfros o problemas respiratorios, tales como seudoefedrina o fenilefrina -procarbazina -sibutramine -ISRS, tales como citalopram, escitalopram, fluoxetina, fluvoxamina, paroxetina y sertralina -estimulantes, tales como dexmetilfenidato, metilfenidato o modafinil -venlafaxina Esta medicina tambin puede interactuar con los siguientes medicamentos: -medicamentos para la diabetes Puede ser que esta lista no menciona todas las posibles interacciones. Informe a su profesional de [employer] de [employer] productos a base de hierbas, medicamentos de [place] o suplementos nutritivos que est tomando. Si usted fuma, consume bebidas alcohlicas o si utiliza drogas ilegales, indqueselo tambin a su profesional de [employer]. Algunas sustancias pueden interactuar con su medicamento. A qu debo estar atento al usar [employer]? Informe a su mdico de inmediato si siente que le falta el aliento mientras realiza sus actividades habituales. No tome este medicamento dentro de 6 horas de la hora de [place]. Puede impedirle dormir. Evite las bebidas que contienen cafena y trata de Pharmacologist un horario de acostarse habitual cada noche. Este medicamento fue diseado para ser utilizado en combinacin con una dieta saludable y ejercicio. De esta manera se obtienen los [employer]. Este medicamento slo est indicado para uso a Product manager. Con el tiempo, su peso puede  estabilizarse. A partir de [employer]ix[employer], el medicamento slo le ayudar a Pharmacologist su [employer]. No aumente ni modifique su dosis de ninguna manera sin consultar a su mdico. Puede experimentar mareos o somnolencia. No conduzca ni utilice maquinaria ni haga nada que le Chartered certified accountant en estado de Control and instrumentation engineer  hasta que sepa cmo le afecta este medicamento. No se siente ni se ponga de pie con rapidez, especialmente si es un paciente de edad avanzada. Esto reduce el riesgo de mareos o [employer]. El alcohol puede aumentar los mareos y la somnolencia. Evite consumir bebidas alcohlicas. Qu efectos secundarios puedo tener al [employer] este medicamento? Efectos secundarios que debe informar a su mdico o a Producer, television/film/video de la salud tan pronto como sea posible: -Journalist, newspaper, palpitaciones -depresin o cambios severos de estados de nimo -aumento de la presin sangunea -irritabilidad -nerviosismo o inquietud -mareos severos -falta de aliento -problemas para orinar -hinchazn inusual de las piernas -vmito Efectos secundarios que, por lo general, no requieren Psychologist, prison and probation services (debe informarlos a su mdico o a su profesional de la salud si persisten o si son molestos): -visin borrosa u otros problemas de ojo -cambios en la capacidad o el deseo sexual -estreimiento o diarrea -dificultad para conciliar el sueo -boca seca o sabor desagradable -dolor de cabeza -nuseas Puede ser que esta lista no menciona todos los posibles efectos secundarios. Comunquese a su mdico por asesoramiento mdico [employer]ard. Usted puede informar los efectos secundarios a la FDA por telfono al 1-800-FDA-1088. Dnde debo guardar mi medicina? Mantenga fuera del alcance de los nios. Existe la posibilidad de abusar de [employer]. Mantenga su medicamento en un lugar seguro para protegerlo contra robos. No comparta este medicamento con nadie. Es peligroso vender o Restaurant manager, fast food, y est  prohibido por la ley. Este medicamento puede causar Neomia Dear sobredosis accidental y la muerte si lo toman otros adultos, nios o Neurosurgeon. Mezcle todo el medicamento sin usar con una sustancia como piedras sanitarias para gatos o posos (residuos) de caf. Luego deseche el [employer] un recipiente cerrado, como una bolsa sellada o una lata de caf con tapa. No utilice este medicamento despus de la fecha de vencimiento. Guarde a [employer], entre 20 y 25 grados Celsius (68 y 47 grados Fahrenheit). Mantenga el recipiente bien cerrado. ATENCIN: Este folleto es un resumen. Puede ser que no cubra toda la posible informacin. Si usted tiene preguntas acerca de esta medicina, consulte con su mdico, su farmacutico o su profesional de Radiographer, therapeutic.  2018 Elsevier/Gold Standard (2016-04-03 00:00:00)

## 2016-12-19 NOTE — Progress Notes (Signed)
Subjective:  Patient ID: Isabel Castillo, female    DOB: 03/15/1976  Age: 41 y.o. MRN: [id number]  CC: medication refill  HPI Isabel Castillo is a 41 y.o. female with a medical history of HLD presents with a request for citalopram and phentermine. No records indicating diagnosis justifying the use of citalopram or phentermine. Patient says she had been prescribed these medications at an outside clinic for depression and weight loss. Reports taking citalopram as needed with immediate effect in reducing her depression. Had been going to a psychologist for some time. Does not disclose what is causing her depression. Does not endorse suicidal ideation or intent.    Wants phentermine to help her lose weight. She reports taking phentermine off and on over some years. Used to go to weight loss clinic. BMI 30.96 today. Does not regularly diet or exercise.       Outpatient Medications Prior to Visit  Medication Sig Dispense Refill  . citalopram (CELEXA) 20 MG tablet Celexa 20 mg tablet  Take 1 tablet every day by oral route in the morning.    . clotrimazole (LOTRIMIN) 1 % cream Apply 1 application topically 2 (two) times daily. 30 g 0  . lovastatin (MEVACOR) 20 MG tablet Take 1 tablet (20 mg total) by mouth at bedtime. 90 tablet 3  . omeprazole (PRILOSEC) 40 MG capsule Take 1 capsule (40 mg total) by mouth daily. 30 capsule 3  . naproxen (NAPROSYN) 500 MG tablet Take 1 tablet (500 mg total) by mouth 2 (two) times daily with a meal. (Patient not taking: Reported on 12/19/2016) 10 tablet 0  . triamcinolone cream (KENALOG) 0.5 % Apply 1 application topically 2 (two) times daily. Do not use for more than 7 consecutive days without medical approval. (Patient not taking: Reported on 12/19/2016) 30 g 0   No facility-administered medications prior to visit.      ROS Review of Systems  Constitutional: Negative for chills, fever and malaise/fatigue.  Eyes: Negative for blurred vision.  Respiratory:  Negative for shortness of breath.   Cardiovascular: Negative for chest pain and palpitations.  Gastrointestinal: Negative for abdominal pain and nausea.  Genitourinary: Negative for dysuria and hematuria.  Musculoskeletal: Negative for joint pain and myalgias.  Skin: Negative for rash.  Neurological: Negative for tingling and headaches.  Psychiatric/Behavioral: Positive for depression. The patient is not nervous/anxious.     Objective:  BP 116/73 (BP Location: Left Arm, Patient Position: Sitting, Cuff Size: Normal)   Pulse 72   Temp 98.2 F (36.8 C) (Oral)   Wt 172 lb (78 kg)   LMP 12/09/2016 (Exact Date)   SpO2 98%   BMI 30.96 kg/m   BP/Weight 12/19/2016 11/29/2016 10/01/2016  Systolic BP 116 92 117  Diastolic BP 73 58 75  Wt. (Lbs) 172 - 170.4  BMI 30.96 - 30.67      Physical Exam  Constitutional: She is oriented to person, place, and time.  Well developed, overweight, NAD, polite  HENT:  Head: Normocephalic and atraumatic.  Eyes: No scleral icterus.  Neck: Normal range of motion. Neck supple. No thyromegaly present.  Pulmonary/Chest: Effort normal.  Musculoskeletal: She exhibits no edema.  Neurological: She is alert and oriented to person, place, and time. No cranial nerve deficit. Coordination normal.  Skin: Skin is warm and dry. No rash noted. No erythema. No pallor.  Psychiatric: Her behavior is normal. Thought content normal.  Constricted affect.  Vitals reviewed.    Assessment & Plan:   1. History  of depression - Begin escitalopram (LEXAPRO) 10 MG tablet; Take 1 tablet (10 mg total) by mouth daily.  Dispense: 30 tablet; Refill: 5 - Message sent to Jenel Lucks, clinical social worker, to reach out to patient for psychological therapy.  2. Class 1 obesity with serious comorbidity and body mass index (BMI) of 30.0 to 30.9 in adult, unspecified obesity type - phentermine denied. I have counseled patient on the risks of phentermine. She seems to have used  phentermine several times before. I have recommended that she first seek counsel    Meds ordered this encounter  Medications  . escitalopram (LEXAPRO) 10 MG tablet    Sig: Take 1 tablet (10 mg total) by mouth daily.    Dispense:  30 tablet    Refill:  5    Order Specific Question:   Supervising Provider    Answer:   Quentin Angst [id number]    Follow-up: Return in about 6 weeks (around 01/30/2017).   Loletta Specter PA

## 2016-12-24 ENCOUNTER — Telehealth: Payer: Self-pay | Admitting: Licensed Clinical Social Worker

## 2016-12-24 NOTE — Telephone Encounter (Signed)
[address], Isabel Castillo ([place] [id number]), was utilized to contact pt. LCSWA introduced self and explained role at [facility]. LCSWA disclosed that she received a consult from Dr. Lily Kocher to address depression.  Pt reported that she last received psychotherapy from [facility] the [place] approximately three years ago. Currently she experiences overwhelming feelings of sadness with hx of SI (last occurrance was 2-3 months ago) Denied current SI/HI/AVH or intent to harm self or others. Pt stated that she was prescribed medication from PCP; however, has not picked up medications.   Pt scheduled appt on 12/30/16 to complete behavioral health assessment with LCSWA.

## 2016-12-30 ENCOUNTER — Ambulatory Visit: Payer: Self-pay | Attending: Physician Assistant | Admitting: Licensed Clinical Social Worker

## 2016-12-30 DIAGNOSIS — F329 Major depressive disorder, single episode, unspecified: Secondary | ICD-10-CM

## 2016-12-30 DIAGNOSIS — F419 Anxiety disorder, unspecified: Principal | ICD-10-CM

## 2016-12-30 NOTE — BH Specialist Note (Signed)
Integrated Behavioral Health Initial Visit  MRN: [id number] Name: Isabel Castillo  Number of Integrated Behavioral Health Clinician visits:: 1/6 Session Start time: 9:05 AM  Session End time: 9:35 AM Total time: 30 minutes  Type of Service: Integrated Behavioral Health- Individual/Family Interpretor:Yes.   Interpretor Name and LanguageMort Sawyers [phone]) Spanish   Warm Hand Off Completed.       SUBJECTIVE: Isabel Castillo is a 41 y.o. female accompanied by self Patient was referred by Dr. Lily Kocher for depression. Patient reports the following symptoms/concerns: feelings of sadness due to consequences of adult children and neck pain Duration of problem: Months; Severity of problem: mild  OBJECTIVE: Mood: Pleasant and Affect: Appropriate Risk of harm to self or others: No plan to harm self or others  LIFE CONTEXT: Family and Social: Pt states that she receives limited support from her adult children and ex-husband. She reports not having any friends School/Work: Pt is employed and receives food stamps Self-Care: Pt has hx of receiving behavioral health services through [employer] of the [place]. She is open to re-initiating services Life Changes: Pt states that she worries about her adult children resulting in stress. She deals with chronic neck and back pain  GOALS ADDRESSED: Patient will: 1. Reduce symptoms of: depression 2. Increase knowledge and/or ability of: coping skills  3. Demonstrate ability to: Increase adequate support systems for patient/family  INTERVENTIONS: Interventions utilized: Solution-Focused Strategies, Supportive Counseling, Psychoeducation and/or Health Education and Link to [facility]  Standardized Assessments completed: Not Needed  ASSESSMENT: Patient currently experiencing mild depression symptoms triggered by chronic pain and family stress. She receives limited support from family. Pt denies SI/HI/AVH. She has been prescribed  medication (Lexapro) however reported that she did not fill prescription.    Patient may benefit from psychoeducation and psychotherapy. LCSWA educated pt on the correlation between physical and mental health, in addition, how stress can negatively impact one's health. Pt successfully identified healthy coping skills that can be applied to decrease symptoms. LCSWA strongly encouraged pt to re-initiate psychotherapy to strengthen support system. Pt agreed to follow up with [facility]. LCSWA taught pt communication technique to assist pt in effectively communicating her feelings with adult children. Community resources for substance use treatment was provided for pt's son, per pt request.   PLAN: 1. Follow up with behavioral health clinician on : Pt was encouraged to contact LCSWA if symptoms worsen or fail to improve to schedule behavioral appointments at [facility]. 2. Behavioral recommendations: LCSWA recommends that pt apply healthy coping skills discussed, re-initiate psychotherapy, and utilize provided resources. Pt is encouraged to schedule follow up appointment with LCSWA 3. Referral(s): Paramedic (LME/Outside Clinic) and Substance Abuse Program 4. "From scale of 1-10, how likely are you to follow plan?": 10/10  Bridgett Larsson, LCSW 01/01/17 3:43 PM

## 2017-01-26 ENCOUNTER — Ambulatory Visit: Payer: Self-pay | Attending: Orthopaedic Surgery | Admitting: Physical Therapy

## 2017-01-30 ENCOUNTER — Ambulatory Visit (INDEPENDENT_AMBULATORY_CARE_PROVIDER_SITE_OTHER): Payer: Self-pay | Admitting: Physician Assistant

## 2017-08-11 DIAGNOSIS — E785 Hyperlipidemia, unspecified: Secondary | ICD-10-CM | POA: Insufficient documentation

## 2017-08-11 DIAGNOSIS — Z720 Tobacco use: Secondary | ICD-10-CM | POA: Insufficient documentation

## 2017-10-07 ENCOUNTER — Ambulatory Visit: Payer: Self-pay | Attending: Family Medicine

## 2017-10-15 ENCOUNTER — Encounter (HOSPITAL_COMMUNITY): Payer: Self-pay | Admitting: Emergency Medicine

## 2017-10-15 ENCOUNTER — Emergency Department (HOSPITAL_COMMUNITY)
Admission: EM | Admit: 2017-10-15 | Discharge: 2017-10-16 | Disposition: A | Discharge status: Home / Self Care | Payer: Self-pay | Attending: Emergency Medicine | Admitting: Emergency Medicine

## 2017-10-15 ENCOUNTER — Other Ambulatory Visit: Payer: Self-pay

## 2017-10-15 DIAGNOSIS — Y999 Unspecified external cause status: Secondary | ICD-10-CM | POA: Insufficient documentation

## 2017-10-15 DIAGNOSIS — S9030XA Contusion of unspecified foot, initial encounter: Secondary | ICD-10-CM | POA: Insufficient documentation

## 2017-10-15 DIAGNOSIS — S161XXA Strain of muscle, fascia and tendon at neck level, initial encounter: Secondary | ICD-10-CM | POA: Insufficient documentation

## 2017-10-15 DIAGNOSIS — Y929 Unspecified place or not applicable: Secondary | ICD-10-CM | POA: Insufficient documentation

## 2017-10-15 DIAGNOSIS — Y939 Activity, unspecified: Secondary | ICD-10-CM | POA: Insufficient documentation

## 2017-10-15 DIAGNOSIS — S301XXA Contusion of abdominal wall, initial encounter: Secondary | ICD-10-CM | POA: Insufficient documentation

## 2017-10-15 DIAGNOSIS — Z331 Pregnant state, incidental: Secondary | ICD-10-CM | POA: Insufficient documentation

## 2017-10-15 DIAGNOSIS — Z3490 Encounter for supervision of normal pregnancy, unspecified, unspecified trimester: Secondary | ICD-10-CM

## 2017-10-15 LAB — SAMPLE TO BLOOD BANK

## 2017-10-15 LAB — CBC
HEMATOCRIT: 40.8 % (ref 36.0–46.0)
HEMOGLOBIN: 13.4 g/dL (ref 12.0–15.0)
MCH: 33 pg (ref 26.0–34.0)
MCHC: 32.8 g/dL (ref 30.0–36.0)
MCV: 100.5 fL — ABNORMAL HIGH (ref 78.0–100.0)
Platelets: 222 10*3/uL (ref 150–400)
RBC: 4.06 MIL/uL (ref 3.87–5.11)
RDW: 12.6 % (ref 11.5–15.5)
WBC: 8.8 10*3/uL (ref 4.0–10.5)

## 2017-10-15 LAB — I-STAT CG4 LACTIC ACID, ED: Lactic Acid, Venous: 1.83 mmol/L (ref 0.5–1.9)

## 2017-10-15 LAB — I-STAT CHEM 8, ED
BUN: 5 mg/dL — ABNORMAL LOW (ref 6–20)
CREATININE: 0.6 mg/dL (ref 0.44–1.00)
Calcium, Ion: 1.09 mmol/L — ABNORMAL LOW (ref 1.15–1.40)
Chloride: 110 mmol/L (ref 98–111)
GLUCOSE: 108 mg/dL — AB (ref 70–99)
HEMATOCRIT: 41 % (ref 36.0–46.0)
Hemoglobin: 13.9 g/dL (ref 12.0–15.0)
POTASSIUM: 4 mmol/L (ref 3.5–5.1)
Sodium: 144 mmol/L (ref 135–145)
TCO2: 21 mmol/L — ABNORMAL LOW (ref 22–32)
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
## 2017-10-16 ENCOUNTER — Emergency Department (HOSPITAL_COMMUNITY): Payer: Self-pay

## 2017-10-16 ENCOUNTER — Encounter (INDEPENDENT_AMBULATORY_CARE_PROVIDER_SITE_OTHER): Payer: Self-pay | Admitting: Physician Assistant

## 2017-10-16 LAB — COMPREHENSIVE METABOLIC PANEL
ALBUMIN: 4 g/dL (ref 3.5–5.0)
ALK PHOS: 53 U/L (ref 38–126)
ALT: 53 U/L — ABNORMAL HIGH (ref 0–44)
AST: 73 U/L — AB (ref 15–41)
Anion gap: 12 (ref 5–15)
BILIRUBIN TOTAL: 0.6 mg/dL (ref 0.3–1.2)
BUN: 6 mg/dL (ref 6–20)
CALCIUM: 8.7 mg/dL — AB (ref 8.9–10.3)
CO2: 17 mmol/L — ABNORMAL LOW (ref 22–32)
CREATININE: 0.5 mg/dL (ref 0.44–1.00)
Chloride: 112 mmol/L — ABNORMAL HIGH (ref 98–111)
GFR calc Af Amer: >60 mL/min (ref >60)
GLUCOSE: 109 mg/dL — AB (ref 70–99)
Potassium: 4.2 mmol/L (ref 3.5–5.1)
Sodium: 141 mmol/L (ref 135–145)
Total Protein: 7 g/dL (ref 6.5–8.1)

## 2017-10-16 LAB — CDS SEROLOGY

## 2017-10-16 LAB — I-STAT BETA HCG BLOOD, ED (MC, WL, AP ONLY)

## 2017-10-16 LAB — URINALYSIS, ROUTINE W REFLEX MICROSCOPIC
Bilirubin Urine: NEGATIVE
Glucose, UA: NEGATIVE mg/dL
Ketones, ur: NEGATIVE mg/dL
Leukocytes, UA: NEGATIVE
Nitrite: NEGATIVE
PROTEIN: NEGATIVE mg/dL
SPECIFIC GRAVITY, URINE: 1.006 (ref 1.005–1.030)
pH: 5 (ref 5.0–8.0)

## 2017-10-16 LAB — PROTIME-INR
INR: 0.92
PROTHROMBIN TIME: 12.2 s (ref 11.4–15.2)

## 2017-10-16 LAB — HCG, QUANTITATIVE, PREGNANCY: hCG, Beta Chain, Quant, S: 287455 m[IU]/mL — ABNORMAL HIGH (ref <5)

## 2017-10-16 LAB — ETHANOL: ALCOHOL ETHYL (B): 204 mg/dL — AB (ref <10)

## 2017-10-16 NOTE — ED Notes (Signed)
Patient transported to X-ray 

## 2017-10-16 NOTE — Discharge Instructions (Signed)
Tylenol 1000 mg every 6 hours as needed for pain.  Rest.  Follow-up with your primary doctor if symptoms are not improving in the next week.

## 2017-10-29 ENCOUNTER — Encounter: Payer: Self-pay | Admitting: Family Medicine

## 2017-10-29 ENCOUNTER — Ambulatory Visit: Payer: Self-pay | Attending: Family Medicine | Admitting: Family Medicine

## 2017-10-29 VITALS — BP 99/82 | HR 82 | Temp 98.9°F | Resp 18 | Ht 62.0 in | Wt 178.0 lb

## 2017-10-29 DIAGNOSIS — O210 Mild hyperemesis gravidarum: Secondary | ICD-10-CM | POA: Insufficient documentation

## 2017-10-29 DIAGNOSIS — T1490XA Injury, unspecified, initial encounter: Secondary | ICD-10-CM | POA: Insufficient documentation

## 2017-10-29 DIAGNOSIS — F17200 Nicotine dependence, unspecified, uncomplicated: Secondary | ICD-10-CM | POA: Insufficient documentation

## 2017-10-29 DIAGNOSIS — R51 Headache: Secondary | ICD-10-CM | POA: Insufficient documentation

## 2017-10-29 DIAGNOSIS — O26891 Other specified pregnancy related conditions, first trimester: Secondary | ICD-10-CM | POA: Insufficient documentation

## 2017-10-29 DIAGNOSIS — Z3A09 9 weeks gestation of pregnancy: Secondary | ICD-10-CM | POA: Insufficient documentation

## 2017-10-29 DIAGNOSIS — O9A211 Injury, poisoning and certain other consequences of external causes complicating pregnancy, first trimester: Secondary | ICD-10-CM | POA: Insufficient documentation

## 2017-10-29 DIAGNOSIS — M62838 Other muscle spasm: Secondary | ICD-10-CM

## 2017-10-29 DIAGNOSIS — Z3491 Encounter for supervision of normal pregnancy, unspecified, first trimester: Secondary | ICD-10-CM

## 2017-10-29 DIAGNOSIS — O99331 Smoking (tobacco) complicating pregnancy, first trimester: Secondary | ICD-10-CM | POA: Insufficient documentation

## 2017-10-29 DIAGNOSIS — O219 Vomiting of pregnancy, unspecified: Secondary | ICD-10-CM

## 2017-10-29 MED ORDER — PROMETHAZINE HCL 12.5 MG PO TABS: 12.5000 mg | ORAL_TABLET | Freq: Three times a day (TID) | ORAL | 0 refills | Status: DC | PRN

## 2017-10-29 MED ORDER — PRENATAL PLUS 27-1 MG PO TABS: 1.0000 | ORAL_TABLET | Freq: Every day | ORAL | 11 refills | Status: AC
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
## 2017-10-29 NOTE — Patient Instructions (Signed)
cido flico y Psychiatristembarazo (Folic Acid and Pregnancy) QU ES EL CIDO FLICO? El cido flico es una vitamina B. El organismo lo necesita para generar nuevas clulas. El cido flico tambin se denomina folato. El folato es la forma de la vitamina B que se encuentra naturalmente en los alimentos. El cido flico es la forma artificial (sinttica) de la vitamina B. El cido flico se aade a ciertos alimentos (alimentos fortificados) y tambin est disponible en los complementos alimenticios como las vitaminas prenatales. Todas la mujeres que pueden quedar embarazadas o planifican quedar embarazadas necesitan ingerir al menos 400a 800mcg de cido flico diariamente. La Harley-Davidsonmayora de las mujeres embarazadas necesitan 600a 800mcg de cido flico por da, aunque algunas mujeres necesitan ms. CULES SON LOS BENEFICIOS DE TOMAR CIDO FLICO DURANTE EL EMBARAZO? Tomar cido flico durante el embarazo ayuda a evitar las anomalas que se pueden desarrollar en el cerebro, la mdula espinal o la columna vertebral del feto (anomalas congnitas del tubo neuronal). Estas anomalas pueden incluir lo siguiente:  La espina bfida. Esta ocurre cuando la columna vertebral no se cierra completamente durante su desarrollo, dejando expuesta a la mdula espinal. Esto significa que los nervios que controlan los movimientos de las piernas y otras funciones del organismo no funcionan. La espina bfida causa discapacidades de por vida.  Anencefalia. Los bebs que nacieron con anencefalia no tienen un cerebro maduro. Pueden tener poco o nada de masa cerebral y tambin les puede faltar algunas partes del crneo. Las anomalas congnitas del tubo neuronal ocurren durante los primeros meses (primer trimestre) del Psychiatristembarazo. Si planifica quedar embarazada, asegrese de ingerir suficiente cido flico durante al menos un mes antes de intentarlo. Es importante tomar el cido flico aun si no planifica quedar embarazada, porque algunos  embarazos no son planificados.Si no planifica un embarazo, comience a tomar cido flico tan pronto como se entere de que est embarazada. El cido flico tambin puede ayudar a Multimedia programmerevitar el descenso (anemia) en la cantidad de glbulos rojos que son los que llevan oxgeno a travs del organismo. Una anemia durante el embarazo se asocia con complicaciones como peso bajo al nacer y nacimientos prematuros. CULES SON LOS EFECTOS SECUNDARIOS DE TOMAR CIDO FLICO? Los complementos de cido flico pueden causar efectos secundarios, como por ejemplo:  Diarrea.  Clicos abdominales. El cido flico tambin puede Product/process development scientistinteractuar con ciertos medicamentos que se usan para tratar otras afecciones. Estos medicamentos son los siguientes:  Metotrexato. Este es un medicamento contra el cncer que tambin se [place]a para tratar Dollar Generalalgunas enfermedades autoinmunes.  Medicamentos antiepilpticos, como la fenitona, la carbamazepina y Heritage managerel valproato.  Sulfasalazina . Este medicamento se Bots[place]ausa para tratar la colitis ulcerosa. CMO DEBO TOMAR EL CIDO FLICO DURANTE EL EMBARAZO? Incluso las mujeres que tienen una dieta saludable y equilibrada no ingieren el suficiente folato con los alimentos. Para el organismo es ms fcil tomar y Chemical engineerutilizar el cido flico sinttico que el folato que se encuentra en forma natural en ciertos alimentos. Adems de consumir alimentos ricos en folato, debe asegurarse de ingerir el cido flico suficiente a travs de vitaminas prenatales y la ingesta de alimentos que estn fortificados con cido flico. Asegrese de que el suplemento de vitaminas B o vitaminas prenatales contiene 400a 800microgramos de cido flico. QU ALIMENTOS DEBO INGERIR? El folato se encuentra naturalmente en:  Verduras de hojas color verde oscuro, como la espinaca.  Esprragos.  Repollitos de Brus[place]as.  Ctricos y jugos.  Nueces.  Frijoles.  Guisantes.  Huevos.  Carnes, aves y Wellingtonmariscos.  Productos de  soja.  Cereales integrales. El cido flico se aade generalmente a ciertos alimentos, como por ejemplo:  Pan.  Pastas.  Arroz blanco.  Los cereales para el desayuno.  Harina.  Harina de maz. CUNDO DEBO BUSCAR ATENCIN MDICA? Algunas mujeres podran necesitar ms cantidad de cido flico. Hable con el mdico sobre la cantidad que necesita ingerir de cido flico si:  Tuvo un beb con Neomia Dear anomala congnita del tubo neuronal y desea quedar embarazada nuevamente.  Tiene antecedentes familiares de espina bfida.  Usted tiene espina bfida y desea quedar embarazada. Consulte a su mdico sobre los complementos de cido flico si est tomando medicamentos para alguna de las siguientes afecciones:  Epilepsia.  Diabetes tipo 2.  Enfermedades autoinmunes, incluyendo artritis reumatoide, lupus, psoriasis, enfermedad celaca y enfermedad inflamatoria del intestino.  Asma.  Enfermedad renal.  Enfermedad heptica.  Anemia drepanoctica. Esta informacin no tiene Theme park manager el consejo del mdico. Asegrese de hacerle al mdico cualquier pregunta que tenga. Document Released: 12/22/2012 Document Revised: 06/25/2015 Document Reviewed: 11/15/2014 Elsevier Interactive Patient Education  2017 [employer].  Primer trimestre de Psychiatrist (First Trimester of Pregnancy) El primer trimestre de Psychiatrist se extiende desde la semana1 hasta el final de la semana12 (mes1 al mes3). Durante este tiempo, el beb comenzar a desarrollarse dentro suyo. Entre la semana6 y [place], se forman los ojos y Clifford, y los latidos del corazn pueden verse en la ecografa. Al final de las 12semanas, todos los rganos del beb estn formados. La atencin prenatal es toda la asistencia mdica que usted recibe antes del nacimiento del beb. Asegrese de recibir una buena atencin prenatal y de seguir todas las indicaciones del mdico. CUIDADOS EN EL HOGAR Medicamentos:  Tome los medicamentos  solamente como se lo haya indicado el mdico. Algunos medicamentos se pueden tomar durante el Psychiatrist y otros no.  Tome las vitaminas prenatales como se lo haya indicado el mdico.  Tome el medicamento que la ayuda a Advertising copywriter (laxante Slater) segn sea necesario, si el mdico lo autoriza. Dieta  Ingiera alimentos saludables de [place] regular.  El Firefighter la cantidad de peso que [place].  No coma carne cruda ni quesos sin cocinar.  Si tiene Programme researcher, broadcasting/film/video (nuseas) o vomita: ? Ingiera 4 o 5comidas pequeas por Geophysical data processor de 3abundantes. ? Intente comer algunas galletitas saladas. ? Beba lquidos Altria Group, en lugar de Boston Scientific.  Si tiene dificultad para defecar (estreimiento): ? Consuma alimentos con alto contenido de [place], como verduras y frutas frescos, y Radiation protection practitioner. ? Beba suficiente lquido para mantener el pis (orina) claro o de color amarillo plido. Actividad y ejercicios  Haga ejercicios solamente como se lo haya indicado el mdico. Deje de hacer ejercicios si tiene clicos o dolor en la parte baja del vientre (abdomen) o en la cintura.  Intente no estar de pie FedEx. Mueva las piernas con frecuencia si debe estar de pie en un lugar durante mucho tiempo.  Evite levantar pesos Fortune Brands.  Use zapatos con tacones bajos. Mantenga una buena postura al sentarse y pararse.  Puede tener The [place] Travelers, a menos que el mdico le indique lo contrario. Alivio del dolor o las molestias  Use un sostn que le brinde buen soporte si le duelen las [place].  Dese baos con agua tibia (baos de asiento) para Engineer, materials o las molestias a causa de las hemorroides. Use crema antihemorroidal si el mdico se lo permite.  Descanse con  las piernas elevadas si tiene calambres o dolor de Saudi Arabiacintura.  Use medias de descanso si tiene las venas de las piernas hinchadas y abultadas (venas varicosas). Eleve los pies durante  15minutos, 3 o 4veces por da. Limite la cantidad de sal en su dieta. Cuidados prenatales  Programe las visitas prenatales para la semana12 de Manchesterembarazo.  Escriba sus preguntas. Llvelas cuando concurra a las visitas prenatales.  Concurra a todas las visitas prenatales como se lo haya indicado el mdico. Seguridad  Colquese el cinturn de seguridad cuando conduzca.  Haga una lista con los nmeros de telfono en caso de Associate Professoremergencia, en la cual deben incluirse los nmeros de los familiares, los amigos, el hospital y los departamentos de polica y de bomberos. Consejos generales  Pdale al mdico que la derive a clases prenatales en su localidad. Debe comenzar a tomar las clases antes de Cytogeneticistentrar en el mes6 de embarazo.  Pida ayuda si necesita asesoramiento o asistencia con la alimentacin. El mdico puede aconsejarla o indicarle dnde recurrir para recibir [place] the Grenadinesayuda.  No se d baos de inmersin en agua caliente, baos turcos ni saunas.  No se haga duchas vaginales ni use tampones o toallas higinicas perfumadas.  No mantenga las piernas cruzadas durante [place] Bethanymucho tiempo.  Evite el contacto con las bandejas sanitarias de los gatos y la tierra que estos animales usan.  No fume, no consuma hierbas ni beba alcohol. No tome frmacos que el mdico no haya autorizado.  No consuma ningn producto que contenga tabaco, lo que incluye cigarrillos, tabaco de Theatre managermascar o Administrator, Civil Servicecigarrillos electrnicos. Si necesita ayuda para dejar de fumar, consulte al American Expressmdico. Puede recibir asesoramiento u otro tipo de apoyo para dejar de fumar.  Visite al dentista. En su casa, lvese los dientes con un cepillo dental suave. Psese el hilo dental con suavidad. SOLICITE AYUDA SI:  Tiene mareos.  Tiene clicos leves o siente presin en la parte baja del vientre.  Siente un dolor persistente en la zona del vientre.  Sigue teniendo AT&Tmalestar estomacal, vomita o las heces son lquidas (diarrea).  Observa una secrecin, con mal  olor que proviene de la vagina.  Siente dolor al ConocoPhillipsorinar.  Tiene el rostro, las Albiamanos, las piernas o los tobillos ms hinchados (inflamados).  SOLICITE AYUDA DE INMEDIATO SI:  Tiene fiebre.  Tiene una prdida de lquido por la vagina.  Tiene sangrado o pequeas prdidas vaginales.  Tiene clicos o dolor muy intensos en el vientre.  Sube o baja de peso rpidamente.  Vomita sangre. Puede ser similar a la borra del caf  Est en contacto con personas que tienen rubola, la quinta enfermedad o varicela.  Siente un dolor de cabeza muy intenso.  Le falta el aire.  Sufre cualquier tipo de traumatismo, por ejemplo, debido a una cada o un accidente automovilstico.  Esta informacin no tiene Theme park managercomo fin reemplazar el consejo del mdico. Asegrese de hacerle al mdico cualquier pregunta que tenga. Document Released: 05/30/2008 Document Revised: 03/24/2014 Document Reviewed: 01/11/2013 Elsevier Interactive Patient Education  2017 ArvinMeritor[employer]vier Inc.

## 2017-11-04 ENCOUNTER — Ambulatory Visit: Payer: Self-pay | Attending: Family Medicine

## 2017-12-03 ENCOUNTER — Other Ambulatory Visit (HOSPITAL_COMMUNITY): Payer: Self-pay | Admitting: Family

## 2017-12-03 DIAGNOSIS — Z3689 Encounter for other specified antenatal screening: Secondary | ICD-10-CM

## 2017-12-03 DIAGNOSIS — O09522 Supervision of elderly multigravida, second trimester: Secondary | ICD-10-CM

## 2017-12-03 DIAGNOSIS — Z3A19 19 weeks gestation of pregnancy: Secondary | ICD-10-CM

## 2017-12-03 LAB — OB RESULTS CONSOLE RUBELLA ANTIBODY, IGM: RUBELLA: IMMUNE

## 2017-12-03 LAB — OB RESULTS CONSOLE ANTIBODY SCREEN: ANTIBODY SCREEN: NEGATIVE

## 2017-12-03 LAB — OB RESULTS CONSOLE RPR: RPR: NONREACTIVE

## 2017-12-03 LAB — OB RESULTS CONSOLE GC/CHLAMYDIA
Chlamydia: NEGATIVE
Gonorrhea: NEGATIVE

## 2017-12-03 LAB — OB RESULTS CONSOLE HEPATITIS B SURFACE ANTIGEN: Hepatitis B Surface Ag: NEGATIVE

## 2017-12-03 LAB — OB RESULTS CONSOLE ABO/RH: RH Type: POSITIVE

## 2017-12-03 LAB — OB RESULTS CONSOLE HIV ANTIBODY (ROUTINE TESTING): HIV: NONREACTIVE

## 2017-12-04 ENCOUNTER — Other Ambulatory Visit: Payer: Self-pay | Admitting: *Deleted

## 2017-12-11 ENCOUNTER — Encounter (HOSPITAL_COMMUNITY): Payer: Self-pay

## 2017-12-17 ENCOUNTER — Encounter (HOSPITAL_COMMUNITY): Payer: Self-pay | Admitting: *Deleted

## 2017-12-18 ENCOUNTER — Ambulatory Visit (HOSPITAL_COMMUNITY)
Admission: RE | Admit: 2017-12-18 | Discharge: 2017-12-18 | Disposition: A | Discharge status: Home / Self Care | Payer: Self-pay | Source: Ambulatory Visit | Attending: Family | Admitting: Family

## 2017-12-18 ENCOUNTER — Other Ambulatory Visit: Payer: Self-pay

## 2017-12-18 ENCOUNTER — Other Ambulatory Visit (HOSPITAL_COMMUNITY): Payer: Self-pay | Admitting: *Deleted

## 2017-12-18 ENCOUNTER — Encounter (HOSPITAL_COMMUNITY): Payer: Self-pay

## 2017-12-18 ENCOUNTER — Ambulatory Visit (HOSPITAL_COMMUNITY)
Admission: RE | Admit: 2017-12-18 | Discharge: 2017-12-18 | Disposition: A | Discharge status: Home / Self Care | Payer: Medicaid Other | Source: Ambulatory Visit | Attending: Family | Admitting: Family

## 2017-12-18 ENCOUNTER — Ambulatory Visit (HOSPITAL_COMMUNITY): Payer: Self-pay

## 2017-12-18 DIAGNOSIS — O09522 Supervision of elderly multigravida, second trimester: Secondary | ICD-10-CM | POA: Diagnosis not present

## 2017-12-18 DIAGNOSIS — Z3689 Encounter for other specified antenatal screening: Secondary | ICD-10-CM | POA: Insufficient documentation

## 2017-12-18 DIAGNOSIS — Z3A18 18 weeks gestation of pregnancy: Secondary | ICD-10-CM | POA: Insufficient documentation

## 2017-12-18 DIAGNOSIS — Z363 Encounter for antenatal screening for malformations: Secondary | ICD-10-CM | POA: Diagnosis not present

## 2017-12-18 DIAGNOSIS — Z3A19 19 weeks gestation of pregnancy: Secondary | ICD-10-CM

## 2017-12-18 DIAGNOSIS — O09529 Supervision of elderly multigravida, unspecified trimester: Secondary | ICD-10-CM

## 2017-12-31 ENCOUNTER — Other Ambulatory Visit (HOSPITAL_COMMUNITY): Payer: Self-pay | Admitting: Nurse Practitioner

## 2017-12-31 DIAGNOSIS — O289 Unspecified abnormal findings on antenatal screening of mother: Secondary | ICD-10-CM

## 2018-01-13 ENCOUNTER — Ambulatory Visit (HOSPITAL_COMMUNITY): Payer: Self-pay

## 2018-01-13 ENCOUNTER — Other Ambulatory Visit (HOSPITAL_COMMUNITY): Payer: Self-pay | Admitting: *Deleted

## 2018-01-13 ENCOUNTER — Encounter (HOSPITAL_COMMUNITY): Payer: Self-pay

## 2018-01-13 ENCOUNTER — Ambulatory Visit (HOSPITAL_COMMUNITY): Admission: RE | Admit: 2018-01-13 | Payer: Self-pay | Source: Ambulatory Visit

## 2018-01-13 ENCOUNTER — Ambulatory Visit (HOSPITAL_COMMUNITY)
Admission: RE | Admit: 2018-01-13 | Discharge: 2018-01-13 | Disposition: A | Discharge status: Home / Self Care | Payer: Medicaid Other | Source: Ambulatory Visit | Attending: Family | Admitting: Family

## 2018-01-13 DIAGNOSIS — O289 Unspecified abnormal findings on antenatal screening of mother: Secondary | ICD-10-CM | POA: Diagnosis not present

## 2018-01-13 DIAGNOSIS — O09522 Supervision of elderly multigravida, second trimester: Secondary | ICD-10-CM | POA: Diagnosis not present

## 2018-01-13 DIAGNOSIS — Z362 Encounter for other antenatal screening follow-up: Secondary | ICD-10-CM

## 2018-01-13 DIAGNOSIS — Z3A21 21 weeks gestation of pregnancy: Secondary | ICD-10-CM

## 2018-01-13 DIAGNOSIS — Z3689 Encounter for other specified antenatal screening: Secondary | ICD-10-CM | POA: Insufficient documentation

## 2018-01-13 DIAGNOSIS — O321XX0 Maternal care for breech presentation, not applicable or unspecified: Secondary | ICD-10-CM | POA: Insufficient documentation

## 2018-01-13 DIAGNOSIS — O281 Abnormal biochemical finding on antenatal screening of mother: Secondary | ICD-10-CM | POA: Insufficient documentation

## 2018-01-13 DIAGNOSIS — O09529 Supervision of elderly multigravida, unspecified trimester: Secondary | ICD-10-CM

## 2018-01-15 ENCOUNTER — Other Ambulatory Visit (HOSPITAL_COMMUNITY): Payer: Self-pay

## 2018-01-15 ENCOUNTER — Telehealth (HOSPITAL_COMMUNITY): Payer: Self-pay | Admitting: *Deleted

## 2018-01-15 NOTE — Telephone Encounter (Signed)
Called patient via spanish interpreter [phone]. Name and DOB verified.  Normal female by [facility] result given.  Pt voiced understanding.

## 2018-01-22 ENCOUNTER — Ambulatory Visit (HOSPITAL_COMMUNITY): Payer: No Typology Code available for payment source

## 2018-02-01 ENCOUNTER — Other Ambulatory Visit (HOSPITAL_COMMUNITY): Payer: Self-pay | Admitting: Family

## 2018-02-03 ENCOUNTER — Telehealth (HOSPITAL_COMMUNITY): Payer: Self-pay | Admitting: *Deleted

## 2018-02-04 ENCOUNTER — Telehealth (HOSPITAL_COMMUNITY): Payer: Self-pay | Admitting: *Deleted

## 2018-02-04 NOTE — Telephone Encounter (Signed)
Name and DOB verified. Normal female results from amniocentesis given.  Pt voiced understanding.

## 2018-02-09 ENCOUNTER — Ambulatory Visit (HOSPITAL_COMMUNITY): Payer: No Typology Code available for payment source

## 2018-02-09 ENCOUNTER — Encounter (HOSPITAL_COMMUNITY): Payer: Self-pay

## 2018-03-17 NOTE — L&D Delivery Note (Signed)
Delivery Note Isabel Castillo is a 43 y.o. [id number] at [redacted]w[redacted]d admitted for IOL for AMA.  Labor course: uncomplicated; received Cytotec x2 and Pitocin ROM: 5h 64m with clear fluid  At 1851 a viable and healthy girl was delivered via spontaneous vaginal delivery (Presentation: cephalic; LOA ).  Infant placed directly on mom's abdomen for bonding/skin-to-skin. Delayed cord clamping x , then cord clamped x 2, and cut by FOB.  APGAR: 8,9 ; weight: pending at time of note.  40 units of pitocin diluted in 1000cc LR was infused rapidly IV per protocol. Large amount of EBL prior to placental separation. Placenta delivered spontaneously via CCT and maternal pushing effort with a small amount of trailing membranes, which required about 5 mins. with the use of ring forceps to remove. It was inspected and appears to be intact with a 3 VC. Cord pH: n/a  Intrapartum complications: None Anesthesia:  none Episiotomy: none Lacerations:  none Suture Repair: n/a Est. Blood Loss (mL): 500 Sponge and instrument count were correct x2.  Mom to postpartum.  Baby to Couplet care / Skin to Skin. Placenta to L&D. Plans to breast/bottlefeed Contraception: IUD (Mirena) Circ: n/a  Isabel Castillo, SNM 05/21/2018 7:33 PM

## 2018-04-26 LAB — OB RESULTS CONSOLE GC/CHLAMYDIA
Chlamydia: NEGATIVE
Gonorrhea: NEGATIVE

## 2018-04-26 LAB — OB RESULTS CONSOLE GBS: STREP GROUP B AG: NEGATIVE

## 2018-05-17 ENCOUNTER — Other Ambulatory Visit: Payer: Self-pay | Admitting: Family Medicine

## 2018-05-17 DIAGNOSIS — F329 Major depressive disorder, single episode, unspecified: Secondary | ICD-10-CM | POA: Insufficient documentation

## 2018-05-17 DIAGNOSIS — F32A Depression, unspecified: Secondary | ICD-10-CM | POA: Insufficient documentation

## 2018-05-18 ENCOUNTER — Telehealth (HOSPITAL_COMMUNITY): Payer: Self-pay | Admitting: *Deleted

## 2018-05-18 ENCOUNTER — Encounter (HOSPITAL_COMMUNITY): Payer: Self-pay | Admitting: *Deleted

## 2018-05-18 NOTE — Telephone Encounter (Signed)
Preadmission screen Interpreter number [phone]

## 2018-05-19 ENCOUNTER — Encounter (HOSPITAL_COMMUNITY): Payer: Self-pay | Admitting: *Deleted

## 2018-05-19 ENCOUNTER — Telehealth (HOSPITAL_COMMUNITY): Payer: Self-pay | Admitting: *Deleted

## 2018-05-19 NOTE — Telephone Encounter (Signed)
Preadmission screen Interpreter number [phone]

## 2018-05-20 ENCOUNTER — Other Ambulatory Visit (HOSPITAL_COMMUNITY): Payer: Self-pay | Admitting: *Deleted

## 2018-05-21 ENCOUNTER — Inpatient Hospital Stay (HOSPITAL_COMMUNITY)
Admission: AD | Admit: 2018-05-21 | Discharge: 2018-05-23 | DRG: 806 | Disposition: A | Discharge status: Home / Self Care | Payer: Medicaid Other | Attending: Obstetrics & Gynecology | Admitting: Obstetrics & Gynecology

## 2018-05-21 ENCOUNTER — Other Ambulatory Visit: Payer: Self-pay

## 2018-05-21 ENCOUNTER — Encounter (HOSPITAL_COMMUNITY): Payer: Self-pay

## 2018-05-21 ENCOUNTER — Inpatient Hospital Stay (HOSPITAL_COMMUNITY): Payer: Medicaid Other

## 2018-05-21 DIAGNOSIS — F331 Major depressive disorder, recurrent, moderate: Secondary | ICD-10-CM | POA: Diagnosis present

## 2018-05-21 DIAGNOSIS — Z9189 Other specified personal risk factors, not elsewhere classified: Secondary | ICD-10-CM

## 2018-05-21 DIAGNOSIS — O99214 Obesity complicating childbirth: Principal | ICD-10-CM | POA: Diagnosis present

## 2018-05-21 DIAGNOSIS — E669 Obesity, unspecified: Secondary | ICD-10-CM | POA: Diagnosis present

## 2018-05-21 DIAGNOSIS — O99344 Other mental disorders complicating childbirth: Secondary | ICD-10-CM | POA: Diagnosis present

## 2018-05-21 DIAGNOSIS — Z3A40 40 weeks gestation of pregnancy: Secondary | ICD-10-CM

## 2018-05-21 DIAGNOSIS — O480 Post-term pregnancy: Secondary | ICD-10-CM | POA: Diagnosis not present

## 2018-05-21 DIAGNOSIS — F329 Major depressive disorder, single episode, unspecified: Secondary | ICD-10-CM | POA: Diagnosis present

## 2018-05-21 DIAGNOSIS — O26893 Other specified pregnancy related conditions, third trimester: Secondary | ICD-10-CM | POA: Diagnosis present

## 2018-05-21 DIAGNOSIS — Z87891 Personal history of nicotine dependence: Secondary | ICD-10-CM

## 2018-05-21 DIAGNOSIS — O09529 Supervision of elderly multigravida, unspecified trimester: Secondary | ICD-10-CM

## 2018-05-21 DIAGNOSIS — F32A Depression, unspecified: Secondary | ICD-10-CM | POA: Diagnosis present

## 2018-05-21 LAB — ABO/RH: ABO/RH(D): A POS

## 2018-05-21 LAB — CBC
HCT: 38.6 % (ref 36.0–46.0)
Hemoglobin: 12.8 g/dL (ref 12.0–15.0)
MCH: 33.2 pg (ref 26.0–34.0)
MCHC: 33.2 g/dL (ref 30.0–36.0)
MCV: 100 fL (ref 80.0–100.0)
Platelets: 250 10*3/uL (ref 150–400)
RBC: 3.86 MIL/uL — ABNORMAL LOW (ref 3.87–5.11)
RDW: 14.2 % (ref 11.5–15.5)
WBC: 11.6 10*3/uL — AB (ref 4.0–10.5)
nRBC: 0 % (ref 0.0–0.2)

## 2018-05-21 LAB — GLUCOSE, CAPILLARY
GLUCOSE-CAPILLARY: 79 mg/dL (ref 70–99)
Glucose-Capillary: 126 mg/dL — ABNORMAL HIGH (ref 70–99)
Glucose-Capillary: 74 mg/dL (ref 70–99)
Glucose-Capillary: 134 mg/dL — ABNORMAL HIGH (ref 70–99)
Glucose-Capillary: 73 mg/dL (ref 70–99)

## 2018-05-21 LAB — TYPE AND SCREEN
ABO/RH(D): A POS
Antibody Screen: NEGATIVE

## 2018-05-21 LAB — RPR: RPR Ser Ql: NONREACTIVE

## 2018-05-21 MED ORDER — LACTATED RINGERS IV SOLN: 500.0000 mL | INTRAVENOUS | Status: DC | PRN

## 2018-05-21 MED ORDER — DIBUCAINE 1 % RE OINT: 1.0000 "application " | TOPICAL_OINTMENT | RECTAL | Status: DC | PRN

## 2018-05-21 MED ORDER — SENNOSIDES-DOCUSATE SODIUM 8.6-50 MG PO TABS
2.0000 | ORAL_TABLET | ORAL | Status: DC
  Administered 2018-05-21: 2 via ORAL
  Filled 2018-05-21: qty 2

## 2018-05-21 MED ORDER — OXYTOCIN BOLUS FROM INFUSION
500.0000 mL | Freq: Once | INTRAVENOUS | Status: AC
  Administered 2018-05-21: 500 mL via INTRAVENOUS

## 2018-05-21 MED ORDER — BENZOCAINE-MENTHOL 20-0.5 % EX AERO
1.0000 "application " | INHALATION_SPRAY | CUTANEOUS | Status: DC | PRN
  Filled 2018-05-21: qty 56

## 2018-05-21 MED ORDER — FLEET ENEMA 7-19 GM/118ML RE ENEM: 1.0000 | ENEMA | RECTAL | Status: DC | PRN

## 2018-05-21 MED ORDER — ACETAMINOPHEN 325 MG PO TABS: 650.0000 mg | ORAL_TABLET | ORAL | Status: DC | PRN

## 2018-05-21 MED ORDER — ONDANSETRON HCL 4 MG/2ML IJ SOLN: 4.0000 mg | INTRAMUSCULAR | Status: DC | PRN

## 2018-05-21 MED ORDER — MISOPROSTOL 25 MCG QUARTER TABLET
25.0000 ug | ORAL_TABLET | ORAL | Status: DC | PRN
  Administered 2018-05-21: 25 ug via VAGINAL
  Filled 2018-05-21: qty 1

## 2018-05-21 MED ORDER — FENTANYL CITRATE (PF) 100 MCG/2ML IJ SOLN
100.0000 ug | INTRAMUSCULAR | Status: DC | PRN
  Administered 2018-05-21: 100 ug via INTRAVENOUS
  Filled 2018-05-21: qty 2

## 2018-05-21 MED ORDER — COCONUT OIL OIL
1.0000 "application " | TOPICAL_OIL | Status: DC | PRN
  Administered 2018-05-21: 1 via TOPICAL

## 2018-05-21 MED ORDER — WITCH HAZEL-GLYCERIN EX PADS: 1.0000 "application " | MEDICATED_PAD | CUTANEOUS | Status: DC | PRN

## 2018-05-21 MED ORDER — TERBUTALINE SULFATE 1 MG/ML IJ SOLN: 0.2500 mg | Freq: Once | INTRAMUSCULAR | Status: DC | PRN

## 2018-05-21 MED ORDER — ONDANSETRON HCL 4 MG/2ML IJ SOLN: 4.0000 mg | Freq: Four times a day (QID) | INTRAMUSCULAR | Status: DC | PRN

## 2018-05-21 MED ORDER — ONDANSETRON HCL 4 MG PO TABS: 4.0000 mg | ORAL_TABLET | ORAL | Status: DC | PRN

## 2018-05-21 MED ORDER — OXYCODONE HCL 5 MG PO TABS: 5.0000 mg | ORAL_TABLET | ORAL | Status: DC | PRN

## 2018-05-21 MED ORDER — ZOLPIDEM TARTRATE 5 MG PO TABS: 5.0000 mg | ORAL_TABLET | Freq: Every evening | ORAL | Status: DC | PRN

## 2018-05-21 MED ORDER — TRANEXAMIC ACID-NACL 1000-0.7 MG/100ML-% IV SOLN
INTRAVENOUS | Status: AC
  Administered 2018-05-21: 1000 mg via INTRAVENOUS
  Filled 2018-05-21: qty 100

## 2018-05-21 MED ORDER — IBUPROFEN 600 MG PO TABS
600.0000 mg | ORAL_TABLET | Freq: Four times a day (QID) | ORAL | Status: DC
  Administered 2018-05-21 – 2018-05-23 (×7): 600 mg via ORAL
  Filled 2018-05-21 (×7): qty 1

## 2018-05-21 MED ORDER — LACTATED RINGERS IV SOLN
INTRAVENOUS | Status: DC
  Administered 2018-05-21 (×3): via INTRAVENOUS

## 2018-05-21 MED ORDER — TRANEXAMIC ACID-NACL 1000-0.7 MG/100ML-% IV SOLN
1000.0000 mg | Freq: Once | INTRAVENOUS | Status: AC
  Administered 2018-05-21: 1000 mg via INTRAVENOUS

## 2018-05-21 MED ORDER — SOD CITRATE-CITRIC ACID 500-334 MG/5ML PO SOLN: 30.0000 mL | ORAL | Status: DC | PRN

## 2018-05-21 MED ORDER — LIDOCAINE HCL (PF) 1 % IJ SOLN: 30.0000 mL | INTRAMUSCULAR | Status: DC | PRN

## 2018-05-21 MED ORDER — OXYTOCIN 40 UNITS IN NORMAL SALINE INFUSION - SIMPLE MED
1.0000 m[IU]/min | INTRAVENOUS | Status: DC
  Administered 2018-05-21: 2 m[IU]/min via INTRAVENOUS

## 2018-05-21 MED ORDER — MISOPROSTOL 50MCG HALF TABLET
50.0000 ug | ORAL_TABLET | Freq: Once | ORAL | Status: AC
  Administered 2018-05-21: 50 ug via ORAL
  Filled 2018-05-21: qty 1

## 2018-05-21 MED ORDER — SIMETHICONE 80 MG PO CHEW: 80.0000 mg | CHEWABLE_TABLET | ORAL | Status: DC | PRN

## 2018-05-21 MED ORDER — TETANUS-DIPHTH-ACELL PERTUSSIS 5-2.5-18.5 LF-MCG/0.5 IM SUSP
0.5000 mL | Freq: Once | INTRAMUSCULAR | Status: AC
  Administered 2018-05-23: 0.5 mL via INTRAMUSCULAR
  Filled 2018-05-21: qty 0.5

## 2018-05-21 MED ORDER — PRENATAL MULTIVITAMIN CH
1.0000 | ORAL_TABLET | Freq: Every day | ORAL | Status: DC
  Administered 2018-05-22 – 2018-05-23 (×2): 1 via ORAL
  Filled 2018-05-21 (×2): qty 1

## 2018-05-21 MED ORDER — OXYTOCIN 40 UNITS IN NORMAL SALINE INFUSION - SIMPLE MED
2.5000 [IU]/h | INTRAVENOUS | Status: DC
  Filled 2018-05-21: qty 1000

## 2018-05-21 MED ORDER — DIPHENHYDRAMINE HCL 25 MG PO CAPS: 25.0000 mg | ORAL_CAPSULE | Freq: Four times a day (QID) | ORAL | Status: DC | PRN

## 2018-05-21 NOTE — Progress Notes (Signed)
[phone] interpreter utilized to introduce myself to pt and adrees concerns & pain issues.

## 2018-05-21 NOTE — Anesthesia Pain Management Evaluation Note (Signed)
  CRNA Pain Management Visit Note  Patient: Isabel Castillo, 43 y.o., female  "Hello I am a member of the anesthesia team at [facility]. We have an anesthesia team available at all times to provide care throughout the hospital, including epidural management and anesthesia for C-section. I don't know your plan for the delivery whether it a natural birth, water birth, IV sedation, nitrous supplementation, doula or epidural, but we want to meet your pain goals."   1.Was your pain managed to your expectations on prior hospitalizations?   Yes   2.What is your expectation for pain management during this hospitalization?     Labor support without medications and Nitrous Oxide  3.How can we help you reach that goal? Be available for emergency.  Record the patient's initial score and the patient's pain goal.   Pain: 2  Pain Goal: 10 [facility] wants you to be able to say your pain was always managed very well.  Renaye Janicki 05/21/2018

## 2018-05-21 NOTE — Lactation Note (Signed)
This note was copied from a baby's chart. Lactation Consultation Note  Patient Name: Isabel Castillo IWPYK'D Date: 05/21/2018 Reason for consult: Initial assessment;Term P5, 4 hour female infant. Mom feels breastfeeding is going well.  Mom experienced at breastfeeding, she breast her other children for 2 years , her youngest child next to infant for only  6 months due to returning to work. Per mom, she breastfeed infant 10 minutes prior to [facility] and  [facility] interpreter [facility]) entering the room. Infant started cuing again and Mom latched  Infant on her  left breast using the cradle hold, infant latched with wide mouth,  tongue down and few swallows observed. Infant was still breastfeeding as LC l and Interpreter eft the room. Mom will work towards increasing breastfeeding duration, LC discussed tips keep infant awake at breast and Mom doing STS while breastfeeding.   Mom knows to breastfeed according hunger cues, 8 or more times within 24 hours.  LC discussed I & O. Mom knows to ask Nurse or LC if she need assistance or help with latching infant to breast.  Mom made aware of O/P services, breastfeeding support groups, community resources, and our phone # for post-discharge questions.   Maternal Data Formula Feeding for Exclusion: Yes Reason for exclusion: Mother's choice to formula and breast feed on admission Has patient been taught Hand Expression?: Yes(Mom taught back hand expression.) Does the patient have breastfeeding experience prior to this delivery?: Yes  Feeding Feeding Type: Breast Fed  LATCH Score Latch: Grasps breast easily, tongue down, lips flanged, rhythmical sucking.  Audible Swallowing: A few with stimulation  Type of Nipple: Everted at rest and after stimulation  Comfort (Breast/Nipple): Soft / non-tender  Hold (Positioning): No assistance needed to correctly position infant at breast.  LATCH Score: 9  Interventions Interventions: Breast feeding basics  reviewed;Skin to skin;Hand express  Lactation Tools Discussed/Used WIC Program: No Pump Review: Setup, frequency, and cleaning;Milk Storage Initiated by:: Danelle Earthly, IBCLC Date initiated:: 05/21/18   Consult Status Consult Status: Follow-up Date: 05/22/18 Follow-up type: In-patient    Danelle Earthly 05/21/2018, 11:01 PM

## 2018-05-21 NOTE — Progress Notes (Signed)
Video interpretor 'Lurena Joiner' used for admission assessment.

## 2018-05-21 NOTE — Progress Notes (Signed)
Labor Progress Note  Subjective: Pt here for IOL for AMA. Pt is doing well this morning. Reports some mild cramping and contractions q5 mins. Pt denies LOF. Family at bedside. Spanish interpretor used via video ipad.   Objective: BP (!) 92/51   Pulse 77   Temp 98.6 F (37 C) (Oral)   Resp 18   LMP 07/10/2017   SpO2 100%  Gen: well-appearing, no distress.  Dilation: 3 Effacement (%): 50 Station: -2 Presentation: Vertex Exam by:: dSimpson,cnm  Assessment and Plan: 43 y.o. [id number] [redacted]w[redacted]d here for IOL for AMA. Contractions q 2-4 mins. Cervix unchanged from prior exam.   Labor: will start Pitocin for augmentation. -- pain control: none now, planning IV pain medication -- PPH Risk: medium  Fetal Well-Being:  -- Cephalic by SVE.  -- Category 1, moderate variability, FHR 130, +accels, no decels -- GBS negative  Camelia Eng, SNM 9:49 AM

## 2018-05-21 NOTE — H&P (Addendum)
OBSTETRIC ADMISSION HISTORY AND PHYSICAL  Isabel Castillo is a 43 y.o. female [id number] with IUP at [redacted]w[redacted]d by 9 week u/s presenting for IOL due to advanced maternal age. She reports +FMs, No LOF, no VB, no blurry vision, headaches or peripheral edema, and RUQ pain.  She plans on both breast and bottle feeding. She requests Mirena IUD for birth control.  She received her prenatal care at [facility]  She denies issues with her prior labors and deliveries including shoulder dystocia and hemorrhage.     Sono:    @[redacted]w[redacted]d , CWD, normal anatomy, Cephalic presentation, 2533g, 39.0% EFW   Prenatal History/Complications: Advanced maternal age  Abnormal glucose complicating pregnancy: elevated 2 hour GTT (157), normal 1 hr and 3 hr screens  History of postpartum mood disturbance: Prescribed Sertraline during pregnancy.  Patient discontinued treatment due to occipital headaches.  Patient under care of a psychologist.  She is not interested in oral treatment at this time.    Abnormal Quad screen with normal amniocentesis  Obesity complicating pregnancy  History of Macrosomia: 1995, 1997, 2004, 2007  Language: Spanish  Past Medical History: Past Medical History:  Diagnosis Date  . AMA (advanced maternal age) multigravida 35+     Past Surgical History: Past Surgical History:  Procedure Laterality Date  . CHOLECYSTECTOMY      Obstetrical History: OB History    Gravida  6   Para  4   Term  4   Preterm  0   AB  1   Living  4     SAB  1   TAB  0   Ectopic  0   Multiple      Live Births              Social History: Social History   Socioeconomic History  . Marital status: Married    Spouse name: Not on file  . Number of children: Not on file  . Years of education: Not on file  . Highest education level: Not on file  Occupational History  . Not on file  Social Needs  . Financial resource strain: Not hard at all  . Food insecurity:   Worry: Never true    Inability: Never true  . Transportation needs:    Medical: Yes    Non-medical: Not on file  Tobacco Use  . Smoking status: Never Smoker  . Smokeless tobacco: Never Used  Substance and Sexual Activity  . Alcohol use: Not Currently  . Drug use: Not Currently    Types: Cocaine    Comment: last used 3 months ago  . Sexual activity: Yes  Lifestyle  . Physical activity:    Days per week: Not on file    Minutes per session: Not on file  . Stress: Not on file  Relationships  . Social connections:    Talks on phone: Not on file    Gets together: Not on file    Attends religious service: Not on file    Active member of club or organization: Not on file    Attends meetings of clubs or organizations: Not on file    Relationship status: Not on file  Other Topics Concern  . Not on file  Social History Narrative   ** Merged History Encounter **        Family History: No family history on file.  Allergies: No Known Allergies  Medications Prior to Admission  Medication Sig Dispense Refill Last Dose  . acetaminophen (  TYLENOL) 325 MG tablet Take 650 mg by mouth every 6 (six) hours as needed.   Taking  . prenatal vitamin w/FE, FA (PRENATAL 1 + 1) 27-1 MG TABS tablet Take 1 tablet by mouth daily at 12 noon. May give available prenatal mvi with folate 30 each 11 Taking  . Sertraline HCl (ZOLOFT PO) Take by mouth.   Taking     Review of Systems   All systems reviewed and negative except as stated in HPI  Blood pressure 121/73, pulse 100, temperature 98.6 F (37 C), temperature source Oral, resp. rate 16, last menstrual period 07/10/2017, SpO2 100 %. General appearance: alert, cooperative and appears stated age Lungs: clear to auscultation bilaterally Heart: regular rate and rhythm Abdomen: soft, non-tender; bowel sounds normal Extremities: Homans sign is negative, no sign of DVT Presentation: cephalic Fetal monitoringBaseline: 150 bpm, Variability: Good {> 6  bpm), Accelerations: Reactive and Decelerations: Absent Uterine activity: irregular     Prenatal labs: ABO, Rh: A/Positive/-- (09/19 0000) Antibody: Negative (09/19 0000) Rubella: Immune (09/19 0000) RPR: Nonreactive (09/19 0000)  HBsAg: Negative (09/19 0000)  HIV: Non-reactive (09/19 0000)  GBS: Negative (02/10 0000)  1 hr Glucola 164 (12/03/2017), 3 hr GTT 72/128/157/92 (02/25/2018) Genetic screening: QUAD= increased risk on 12/03/2017, normal amnio on 01/13/2018  Anatomy US normal  Prenatal Transfer Tool  Maternal Diabetes: No Genetic Screening: QUAD=increased risk, amniocentesis=46XX Maternal Ultrasounds/Referrals: Normal Fetal Ultrasounds or other Referrals:  None Maternal Substance Abuse:  No Significant Maternal Medications:  None Significant Maternal Lab Results: None  No results found for this or any previous visit (from the past 24 hour(s)).  Patient Active Problem List   Diagnosis Date Noted  . Gestational diabetes 05/21/2018  . Depressive disorder 05/17/2018  . Dyslipidemia 08/11/2017  . Tobacco user 08/11/2017  . Gastritis and gastroduodenitis 07/24/2016  . Moderate recurrent major depression (HCC) 01/09/2016    Assessment/Plan:  Isabel Castillo is a 43 y.o. [id number] at [redacted]w[redacted]d here for IOL at [redacted]w[redacted]d due to advanced maternal age  #Labor: Induction of labor with cytotec and pitocin #Pain: Fentanyl, Epidural by maternal request #FWB: Cat I, moderate variability with FHR 150 bpm #ID:  GBS -  #MOF: Both #MOC: Mirena IUD #Circ:  N/a   Francene Boyers, Student-PA  05/21/2018, 12:49 AM  OB FELLOW HISTORY AND PHYSICAL ATTESTATION  I have seen and examined this patient; I agree with above documentation in the resident's note.   Gwenevere Abbot, MD  OB Fellow  05/21/2018, 9:36 PM

## 2018-05-21 NOTE — Progress Notes (Signed)
LABOR PROGRESS NOTE  Isabel Castillo is a 43 y.o. [id number] at [redacted]w[redacted]d  admitted for IOL for AMA.  Subjective: Doing well. Sleeping upon entering. Pain is tolerable.  Objective: BP 103/64   Pulse 78   Temp 98.6 F (37 C) (Oral)   Resp 18   LMP 07/10/2017   SpO2 100%  or  Vitals:   05/21/18 0201 05/21/18 0301 05/21/18 0401 05/21/18 0501  BP: 102/67 (!) 100/52 104/63 103/64  Pulse: 88 90 87 78  Resp: 18 18 18 18   Temp:      TempSrc:      SpO2:        Dilation: 3 Effacement (%): 50 Station: -2 Presentation: Vertex Exam by:: Dr. Sabino Gasser FHT: baseline rate 145, moderate varibility, + acel, no decel Toco: irregular   Labs: Lab Results  Component Value Date   WBC 11.6 (H) 05/21/2018   HGB 12.8 05/21/2018   HCT 38.6 05/21/2018   MCV 100.0 05/21/2018   PLT 250 05/21/2018    Patient Active Problem List   Diagnosis Date Noted  . Gestational diabetes 05/21/2018  . Depressive disorder 05/17/2018  . Dyslipidemia 08/11/2017  . Tobacco user 08/11/2017  . Gastritis and gastroduodenitis 07/24/2016  . Moderate recurrent major depression (HCC) 01/09/2016    Assessment / Plan: 43 y.o. [id number] at [redacted]w[redacted]d here for IOL for VAMA.  Labor: minimal progression. S/p cytotec x 1. Will give oral cytotec. Fetal Wellbeing:  Cat I Pain Control:  IV pain meds/Epidural upon request Anticipated MOD:  NSVD  Orpah Cobb, D.O. [facility], PGY1 05/21/2018, 5:47 AM

## 2018-05-21 NOTE — Progress Notes (Signed)
Upon entering pt room to review safety policies and admission documents, I informed pt I was contacting the spanish interpreter.  Patient declined spanish interpreter, stating in english that she did not need it.  I informed her that if she at anytime needed an interpreter to let me know, I would contact them.  She was given spanish literature documents. She responded to questions and asked questions of her own in english appropriately.  Pt verbalized she understands and signed safety agreement document. Reinformed pt that we have access to spanish interpreter at any time for future reference during her stay.

## 2018-05-21 NOTE — Discharge Summary (Signed)
OB Discharge Summary     Patient Name: Isabel Castillo DOB: Sep 05, 1975 MRN: [id number]  Date of admission: 05/21/2018 Delivering MD: Brand Males   Date of discharge: 05/23/2018  Admitting diagnosis: pregnancy Intrauterine pregnancy: [redacted]w[redacted]d     Secondary diagnosis:  Active Problems:   Depressive disorder   AMA (advanced maternal age) multigravida 35+  Additional problems: none     Discharge diagnosis: Term Pregnancy Delivered                                                                                                Post partum procedures:none  Augmentation: AROM, Pitocin and Cytotec  Complications: None  Hospital course:  Induction of Labor With Vaginal Delivery   43 y.o. yo [id number] at [redacted]w[redacted]d was admitted to the hospital 05/21/2018 for induction of labor.  Indication for induction: AMA. During her prenatal care she had one high value on her 3hr GTT in both early and 28wk testing. Patient had an uncomplicated labor course as follows: Membrane Rupture Time/Date: 1:32 PM ,05/21/2018   Intrapartum Procedures: Episiotomy: None [1]                                         Lacerations:  Perineal [11]  Patient had delivery of a Viable infant.  Information for the patient's newborn:  Xitlali, Rothfus [[id number]]  Delivery Method: Vaginal, Spontaneous(Filed from Delivery Summary)   05/21/2018  Details of delivery can be found in separate delivery note.  Patient had a routine postpartum course. Patient is discharged home 05/23/18.  Physical exam  Vitals:   05/22/18 0945 05/22/18 1442 05/22/18 2207 05/23/18 0533  BP: 96/61 103/70 98/62 95/64   Pulse: 86 86 85 93  Resp: 18 17 14 16   Temp: 98.5 F (36.9 C) 98.2 F (36.8 C) 98.5 F (36.9 C) 98.4 F (36.9 C)  TempSrc: Oral Oral Oral Oral  SpO2: 99% 100% 100%    General: alert, cooperative and no distress Lochia: appropriate Uterine Fundus: firm Incision: N/A DVT Evaluation: No evidence of DVT seen on physical  exam. Labs: Lab Results  Component Value Date   WBC 11.6 (H) 05/21/2018   HGB 12.8 05/21/2018   HCT 38.6 05/21/2018   MCV 100.0 05/21/2018   PLT 250 05/21/2018   CMP Latest Ref Rng & Units 10/15/2017  Glucose 70 - 99 mg/dL 846(K)  BUN 6 - 20 mg/dL 5(L)  Creatinine 5.99 - 1.00 mg/dL 3.57  Sodium 017 - 793 mmol/L 144  Potassium 3.5 - 5.1 mmol/L 4.0  Chloride 98 - 111 mmol/L 110  CO2 22 - 32 mmol/L -  Calcium 8.9 - 10.3 mg/dL -  Total Protein 6.5 - 8.1 g/dL -  Total Bilirubin 0.3 - 1.2 mg/dL -  Alkaline Phos 38 - 903 U/L -  AST 15 - 41 U/L -  ALT 0 - 44 U/L -    Discharge instruction: per After Visit Summary and "Baby and Me Booklet".  After visit meds:  Allergies as of 05/23/2018  No Known Allergies     Medication List    TAKE these medications   ibuprofen 600 MG tablet Commonly known as:  ADVIL,MOTRIN Take 1 tablet (600 mg total) by mouth every 6 (six) hours.   prenatal vitamin w/FE, FA 27-1 MG Tabs tablet Take 1 tablet by mouth daily at 12 noon. May give available prenatal mvi with folate   Tylenol 325 MG tablet Generic drug:  acetaminophen Take 650 mg by mouth every 6 (six) hours as needed for mild pain or headache.       Diet: routine diet  Activity: Advance as tolerated. Pelvic rest for 6 weeks.   Outpatient follow up:6 weeks Follow up Appt:No future appointments. Follow up Visit:No follow-ups on file.  Postpartum contraception: IUD Mirena  Newborn Data: Live born female  Birth Weight:   APGAR: 9, 9  Newborn Delivery   Birth date/time:  05/21/2018 18:51:00 Delivery type:  Vaginal, Spontaneous     Baby Feeding: Both Disposition:home with mother   05/23/2018 Gwenevere Abbot, MD

## 2018-05-21 NOTE — Progress Notes (Signed)
OB/GYN Faculty Practice: Labor Progress Note  Subjective: Pt doing well. Denies contractions, but reports some abdominal pressure.  Objective: BP 126/70   Pulse 73   Temp 98.3 F (36.8 C) (Oral)   Resp 18   LMP 07/10/2017   SpO2 100%  Gen: well-appearing, no distress Dilation: 3.5 Effacement (%): 50 Station: -2 Presentation: Vertex Exam by:: Johnathan Heskett, CNM  Assessment and Plan: 43 y.o. [id number] [redacted]w[redacted]d here for IOL for AMA. Cervical exam unchanged from prior check. AROM @1340 -clear fluid. Continue pitocin. Anticipate NSVD.  Labor: pitocin -- pain control: planning IV pain meds -- PPH Risk: medium  Fetal Well-Being:  -- Category 1; FHR 145, moderate variability, +accels, no decels -- contractions q2-3 mins -- continuous fetal monitoring   Camelia Eng, SNM 1:43 PM

## 2018-05-21 NOTE — Progress Notes (Signed)
[employer] interpreter Mikle Bosworth' consulted for completion of admission assessment and cytotec placement

## 2018-05-22 NOTE — Lactation Note (Signed)
This note was copied from a baby's chart. Lactation Consultation Note  Patient Name: Girl Lennetta Storlie RSWNI'O Date: 05/22/2018 Reason for consult: Follow-up assessment;Infant weight loss;Term  42 hours old FT female who is being exclusively BF by her mother at this point, she's a P5. Mom is experienced, BF but chose to do both, breast and formula as her feeding choice on admission, but baby is still on breastmilk, praised her for her efforts. Per mom BF is going well, feedings at the breast are comfortable and she's able to hear baby swallowing when at the breast.   Offered assistance with latch, but mom politely declined stating that baby already fed and she didn't want to disturb her, she was asleep in her bassinet. Asked mom to call for assistance when needed. Discussed feeding cues, normal newborn behavior and cluster feeding.  Feeding plan:  1. Encouraged mom to feed baby STS 8-12 times/24 hours or sooner if feeding cues are present 2. Hand expression and spoon feeding was also encouraged, but mom prefers to just put baby to the breast at this point, she'll take her hand pump at home though because she doesn't have one  Parents reported all questions and concerns were answered, they're both aware of LC services and will call PRN.  Maternal Data    Feeding Feeding Type: Breast Fed  Interventions Interventions: Breast feeding basics reviewed  Lactation Tools Discussed/Used     Consult Status Consult Status: Follow-up Date: 05/23/18 Follow-up type: In-patient    Dashon Mcintire Venetia Constable 05/22/2018, 1:20 PM

## 2018-05-22 NOTE — Progress Notes (Signed)
Post Partum Day 1 Subjective: no complaints, up ad lib, voiding and tolerating PO  Objective: Blood pressure 106/68, pulse 85, temperature 98.5 F (36.9 C), temperature source Oral, resp. rate 16, last menstrual period 07/10/2017, SpO2 99 %, unknown if currently breastfeeding.  Physical Exam:  General: alert, cooperative, appears stated age and no distress Lochia: appropriate Uterine Fundus: firm Incision: NA DVT Evaluation: No evidence of DVT seen on physical exam.  Recent Labs    05/21/18 0102  HGB 12.8  HCT 38.6    Assessment/Plan: Plan for discharge tomorrow   LOS: 1 day   Gwenevere Abbot 05/22/2018, 9:35 AM

## 2018-05-23 DIAGNOSIS — O480 Post-term pregnancy: Secondary | ICD-10-CM

## 2018-05-23 DIAGNOSIS — Z3A40 40 weeks gestation of pregnancy: Secondary | ICD-10-CM

## 2018-05-23 MED ORDER — IBUPROFEN 600 MG PO TABS: 600.0000 mg | ORAL_TABLET | Freq: Four times a day (QID) | ORAL | 0 refills | Status: AC
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
## 2018-05-23 NOTE — Lactation Note (Signed)
This note was copied from a baby's chart. Lactation Consultation Note  Patient Name: Girl Arleane Spinale IAXKP'V Date: 05/23/2018 Reason for consult: Follow-up assessment   Video interpreter used for Spanish.  P5, Baby 38 hours old.  Mother states she is adding formula until her milk supply "comes in". Discussed supply and demand. Mother latched baby in cradle hold.  Mother pulling breast tissue away from nose. Provided education. Feed on demand approximately 8-12 times per day.   Reviewed engorgement care and monitoring voids/stools.    Maternal Data    Feeding Feeding Type: Breast Fed  LATCH Score Latch: Grasps breast easily, tongue down, lips flanged, rhythmical sucking.  Audible Swallowing: A few with stimulation  Type of Nipple: Everted at rest and after stimulation  Comfort (Breast/Nipple): Soft / non-tender  Hold (Positioning): No assistance needed to correctly position infant at breast.  LATCH Score: 9  Interventions Interventions: Breast feeding basics reviewed  Lactation Tools Discussed/Used     Consult Status Consult Status: Complete Date: 05/23/18    Dahlia Byes [facility] 05/23/2018, 9:31 AM

## 2019-01-05 ENCOUNTER — Other Ambulatory Visit (HOSPITAL_COMMUNITY): Payer: Self-pay | Admitting: *Deleted

## 2019-01-05 DIAGNOSIS — N6310 Unspecified lump in the right breast, unspecified quadrant: Secondary | ICD-10-CM

## 2019-01-20 ENCOUNTER — Encounter (HOSPITAL_COMMUNITY): Payer: Self-pay

## 2019-01-27 ENCOUNTER — Ambulatory Visit (HOSPITAL_COMMUNITY)
Admission: RE | Admit: 2019-01-27 | Discharge: 2019-01-27 | Disposition: A | Discharge status: Home / Self Care | Payer: Self-pay | Source: Ambulatory Visit | Attending: Obstetrics and Gynecology | Admitting: Obstetrics and Gynecology

## 2019-01-27 ENCOUNTER — Ambulatory Visit (HOSPITAL_COMMUNITY): Payer: Self-pay

## 2019-01-27 ENCOUNTER — Ambulatory Visit: Payer: Self-pay

## 2019-01-27 ENCOUNTER — Other Ambulatory Visit: Payer: Self-pay

## 2019-01-27 ENCOUNTER — Encounter (HOSPITAL_COMMUNITY): Payer: Self-pay

## 2019-01-27 ENCOUNTER — Ambulatory Visit
Admission: RE | Admit: 2019-01-27 | Discharge: 2019-01-27 | Disposition: A | Discharge status: Home / Self Care | Payer: No Typology Code available for payment source | Source: Ambulatory Visit | Attending: Obstetrics and Gynecology | Admitting: Obstetrics and Gynecology

## 2019-01-27 DIAGNOSIS — N6313 Unspecified lump in the right breast, lower outer quadrant: Secondary | ICD-10-CM | POA: Insufficient documentation

## 2019-01-27 DIAGNOSIS — N6315 Unspecified lump in the right breast, overlapping quadrants: Secondary | ICD-10-CM | POA: Insufficient documentation

## 2019-01-27 DIAGNOSIS — N6311 Unspecified lump in the right breast, upper outer quadrant: Secondary | ICD-10-CM | POA: Insufficient documentation

## 2019-01-27 DIAGNOSIS — Z1239 Encounter for other screening for malignant neoplasm of breast: Secondary | ICD-10-CM

## 2019-01-27 DIAGNOSIS — N6310 Unspecified lump in the right breast, unspecified quadrant: Secondary | ICD-10-CM

## 2019-01-27 HISTORY — DX: Unspecified injury of thorax, initial encounter: S29.9XXA

## 2019-01-27 NOTE — Patient Instructions (Addendum)
Explained breast self awareness with Harle Battiest. Patient did not need a Pap smear today due to last Pap smear was 12/03/2017 per patient. Let her know [facility] will cover Pap smears every 3 years unless has a history of abnormal Pap smears. Referred patient to the [facility] for a diagnostic mammogram and right breast ultrasound. Appointment scheduled for Thursday, January 27, 2019 at 1400. Patient aware of appointment and will be there. Zoi Devine verbalized understanding.  Eulla Kochanowski, Arvil Chaco, RN 1:04 PM

## 2019-01-27 NOTE — Progress Notes (Signed)
Complaints of multiple right breast lumps since September 2019.   Pap Smear: Pap smear not completed today. Last Pap smear was 12/03/2017 at the [facility] and normal per patient. Per patient has a history of an abnormal Pap smear in 2007 that a colposcopy was completed for follow-up. Patient states she has had at least three normal Pap smears since colposcopy. Last Pap smear result is not in Epic. Previous Pap smear result from 09/09/2016 is in [place].  Physical exam: Breasts Breasts symmetrical. No skin abnormalities bilateral breasts. No nipple retraction bilateral breasts. No nipple discharge bilateral breasts. No lymphadenopathy. No lumps palpated left breast. Palpated four lumps within the right breast at 12 o'clock 9 cm from the nipple, 8 o'clock 3 cm from the nipple, 11 o'clock 5 cm from the nipple, and 9 o'clock 2 cm from the nipple. No complaints of pain or tenderness on exam. Referred patient to the [place] for a diagnostic mammogram and right breast ultrasound. Appointment scheduled for Thursday, January 27, 2019 at 1400.        Pelvic/Bimanual No Pap smear completed today since last Pap smear was 12/03/2017 per patient. Pap smear not indicated per BCCCP guidelines.   Smoking History: Patient has never smoked.  Patient Navigation: Patient education provided. Access to services provided for patient through [facility] program. Spanish interpreter provided.   Breast and Cervical Cancer Risk Assessment: Patient has no family history of breast cancer, known genetic mutations, or radiation treatment to the chest before age 27. Per patient has a history of cervical dysplasia. Patient has no history of being immunocompromised or DES exposure in-utero.  Risk Assessment    Risk Scores      01/27/2019   Last edited by: Loletta Parish, RN   5-year risk: 0.3 %   Lifetime risk: 5.1 %         Used Spanish interpreter [name] from [place].

## 2019-07-07 ENCOUNTER — Ambulatory Visit: Payer: No Typology Code available for payment source | Attending: Internal Medicine

## 2019-07-07 DIAGNOSIS — Z23 Encounter for immunization: Secondary | ICD-10-CM

## 2019-07-07 NOTE — Progress Notes (Signed)
   Covid-19 Vaccination Clinic  Name:  Isabel Castillo    MRN: [id number] DOB: [birth year]  07/07/2019  [name] was observed post Covid-19 immunization for 15 minutes without incident. She was provided with Vaccine Information Sheet and instruction to access the V-Safe system.   [name] was instructed to call 911 with any severe reactions post vaccine: Marland Kitchen Difficulty breathing  . Swelling of face and throat  . A fast heartbeat  . A bad rash all over body  . Dizziness and weakness   Immunizations Administered    Name Date Dose VIS Date Route   Pfizer COVID-19 Vaccine 07/07/2019  8:29 AM 0.3 mL 05/11/2018 Intramuscular   Manufacturer: [employer], [employer]   Lot: [id number]   NDC: [address]-2

## 2019-07-11 ENCOUNTER — Other Ambulatory Visit: Payer: No Typology Code available for payment source

## 2019-08-01 ENCOUNTER — Ambulatory Visit: Payer: No Typology Code available for payment source | Attending: Internal Medicine

## 2019-08-01 DIAGNOSIS — Z23 Encounter for immunization: Secondary | ICD-10-CM

## 2019-08-01 NOTE — Progress Notes (Signed)
   Covid-19 Vaccination Clinic  Name:  Rolanda Campa    MRN: [id number] DOB: 06-20-1975  08/01/2019  Ms. Chasse was observed post Covid-19 immunization for 15 minutes without incident. She was provided with Vaccine Information Sheet and instruction to access the V-Safe system.   Ms. Mattes was instructed to call 911 with any severe reactions post vaccine: Marland Kitchen Difficulty breathing  . Swelling of face and throat  . A fast heartbeat  . A bad rash all over body  . Dizziness and weakness   Immunizations Administered    Name Date Dose VIS Date Route   Pfizer COVID-19 Vaccine 08/01/2019  8:24 AM 0.3 mL 05/11/2018 Intramuscular   Manufacturer: [employer], [employer]   Lot: [id number]   NDC: [address]-9

## 2019-12-23 ENCOUNTER — Other Ambulatory Visit: Payer: Self-pay | Admitting: Obstetrics and Gynecology

## 2019-12-23 DIAGNOSIS — Z1231 Encounter for screening mammogram for malignant neoplasm of breast: Secondary | ICD-10-CM

## 2020-02-23 ENCOUNTER — Ambulatory Visit: Payer: Self-pay | Admitting: *Deleted

## 2020-02-23 ENCOUNTER — Encounter (INDEPENDENT_AMBULATORY_CARE_PROVIDER_SITE_OTHER): Payer: Self-pay

## 2020-02-23 ENCOUNTER — Ambulatory Visit
Admission: RE | Admit: 2020-02-23 | Discharge: 2020-02-23 | Disposition: A | Discharge status: Home / Self Care | Payer: No Typology Code available for payment source | Source: Ambulatory Visit | Attending: Obstetrics and Gynecology | Admitting: Obstetrics and Gynecology

## 2020-02-23 ENCOUNTER — Other Ambulatory Visit: Payer: Self-pay

## 2020-02-23 VITALS — BP 124/80 | Wt 203.0 lb

## 2020-02-23 DIAGNOSIS — Z1231 Encounter for screening mammogram for malignant neoplasm of breast: Secondary | ICD-10-CM

## 2020-02-23 DIAGNOSIS — Z1239 Encounter for other screening for malignant neoplasm of breast: Secondary | ICD-10-CM

## 2020-02-23 NOTE — Patient Instructions (Addendum)
Explained breast self awareness with Malva Cogan. Patient did not need a Pap smear today due to last Pap smear was 12/03/2017 per patient. Let her know BCCCP will cover Pap smears every 3 years unless has a history of abnormal Pap smears. Referred patient to the [facility] of [facility] for a screening mammogram per recommendation on the mobile unit. Appointment scheduled Thursday, February 23, 2020 at 1000. Patient escorted to the mobile unit for her screening mammogram following BCCCP appointment. Let patient know the Breast Center will follow up with her within the next couple weeks with results of her mammogram by letter or phone. Darwin Guastella verbalized understanding.  Lizeth Bencosme, Kathaleen Maser, RN 9:05 AM

## 2020-02-23 NOTE — Progress Notes (Signed)
[name] is a 44 y.o. female who presents to [facility] clinic today with complaint of same lumps as noted on exam 01/27/2019. Patient stated there are no changes from previous exam.    Pap Smear: Pap smear not completed today. Last Pap smear was 12/03/2017 at the [facility] Department and normal per patient. Per patient has a history of an abnormal Pap smear in 2007 that a colposcopy was completed for follow-up. Patient states she has had at least three normal Pap smears since colposcopy.    Physical exam: Breasts Breasts symmetrical. No skin abnormalities bilateral breasts. No nipple retraction bilateral breasts. No nipple discharge bilateral breasts. No lymphadenopathy. No lumps palpated left breast. Palpated four lumps within the right breast at 12 o'clock 9 cm from the nipple, 8 o'clock 3 cm from the nipple, 11 o'clock 5 cm from the nipple, and 9 o'clock 2 cm from the nipple. All lumps are the same as palpated on exam 01/27/2019. Patient had a diagnostic mammogram completed 01/27/2019 for follow-up that was benign with a screening mammogram recommended in one year. No complaints of pain or tenderness on exam.    Pelvic/Bimanual Pap is not indicated today per BCCCP guidelines.   Smoking History: Patient has never smoked.   Patient Navigation: Patient education provided. Access to services provided for patient through [place] program. Spanish interpreter Natale Lay from [facility] provided.    Breast and Cervical Cancer Risk Assessment: Patient has no family history of breast cancer, known genetic mutations, or radiation treatment to the chest before age 75. Per patient has a history of cervical dysplasia. Patient has no history of being immunocompromised or DES exposure in-utero.  Risk Assessment    Risk Scores      02/23/2020 01/27/2019   Last edited by: Narda Rutherford, LPN Conner Muegge, Carlye Grippe, RN   5-year risk: 0.4 % 0.3 %   Lifetime risk: 5 % 5.1 %          A: BCCCP exam without pap smear Complaints same as noted on exam 01/27/2019.  P: Referred patient to the [facility] for a screening mammogram per recommendation on the mobile unit. Appointment scheduled Thursday, February 23, 2020 at 1000.  Priscille Heidelberg, RN 02/23/2020 9:05 AM

## 2020-09-24 ENCOUNTER — Other Ambulatory Visit: Payer: Self-pay

## 2020-09-24 DIAGNOSIS — N6310 Unspecified lump in the right breast, unspecified quadrant: Secondary | ICD-10-CM

## 2020-10-09 ENCOUNTER — Ambulatory Visit: Payer: Self-pay | Admitting: *Deleted

## 2020-10-09 ENCOUNTER — Ambulatory Visit: Payer: No Typology Code available for payment source

## 2020-10-09 ENCOUNTER — Encounter (INDEPENDENT_AMBULATORY_CARE_PROVIDER_SITE_OTHER): Payer: Self-pay

## 2020-10-09 ENCOUNTER — Ambulatory Visit
Admission: RE | Admit: 2020-10-09 | Discharge: 2020-10-09 | Disposition: A | Discharge status: Home / Self Care | Payer: No Typology Code available for payment source | Source: Ambulatory Visit | Attending: Obstetrics and Gynecology | Admitting: Obstetrics and Gynecology

## 2020-10-09 ENCOUNTER — Other Ambulatory Visit: Payer: Self-pay

## 2020-10-09 VITALS — BP 120/84 | Wt 210.7 lb

## 2020-10-09 DIAGNOSIS — N6310 Unspecified lump in the right breast, unspecified quadrant: Secondary | ICD-10-CM

## 2020-10-09 DIAGNOSIS — N644 Mastodynia: Secondary | ICD-10-CM

## 2020-10-09 DIAGNOSIS — Z1239 Encounter for other screening for malignant neoplasm of breast: Secondary | ICD-10-CM
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
## 2020-10-09 NOTE — Patient Instructions (Signed)
Explained breast self awareness with Isabel Castillo. Patient did not need a Pap smear today due to last Pap smear was 12/03/2017 per patient. Let her know BCCCP will cover Pap smears every 3 years unless has a history of abnormal Pap smears. Patient scheduled for a Pap smear at the free cervical cancer screening on Thursday, December 06, 2020 at 0915. Referred patient to the [facility] of [facility] for a diagnostic mammogram. Appointment scheduled Tuesday, October 09, 2020 at 1400. Patient aware of appointments and will be there. Isabel Castillo verbalized understanding.  Isabel Castillo, Isabel Maser, RN 11:49 AM

## 2020-12-06 ENCOUNTER — Other Ambulatory Visit: Payer: Self-pay | Admitting: *Deleted

## 2020-12-06 ENCOUNTER — Other Ambulatory Visit: Payer: Self-pay

## 2020-12-06 ENCOUNTER — Encounter (INDEPENDENT_AMBULATORY_CARE_PROVIDER_SITE_OTHER): Payer: Self-pay

## 2020-12-06 DIAGNOSIS — Z124 Encounter for screening for malignant neoplasm of cervix: Secondary | ICD-10-CM
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
## 2020-12-07 LAB — CERVICOVAGINAL ANCILLARY ONLY
Bacterial Vaginitis (gardnerella): NEGATIVE
Candida Glabrata: NEGATIVE
Candida Vaginitis: NEGATIVE
Comment: NEGATIVE
Comment: NEGATIVE
Comment: NEGATIVE
Comment: NEGATIVE
Trichomonas: NEGATIVE

## 2020-12-10 LAB — CYTOLOGY - PAP
Comment: NEGATIVE
Diagnosis: NEGATIVE
Diagnosis: REACTIVE
High risk HPV: NEGATIVE

## 2020-12-11 ENCOUNTER — Telehealth: Payer: Self-pay

## 2020-12-11 NOTE — Telephone Encounter (Signed)
Called patient to give pap smear results. Informed patient that pap smear was normal and HPV was negative. Based on this result her next pap smear will be due in 3 years. Patient voiced understanding.   Informed patient that wet prep was negative. Patient voiced understanding.

## 2021-02-25 ENCOUNTER — Other Ambulatory Visit: Payer: Self-pay

## 2021-02-25 DIAGNOSIS — Z1231 Encounter for screening mammogram for malignant neoplasm of breast: Secondary | ICD-10-CM

## 2021-04-16 ENCOUNTER — Ambulatory Visit
Admission: RE | Admit: 2021-04-16 | Discharge: 2021-04-16 | Disposition: A | Discharge status: Home / Self Care | Payer: No Typology Code available for payment source | Source: Ambulatory Visit | Attending: Obstetrics and Gynecology | Admitting: Obstetrics and Gynecology

## 2021-04-16 ENCOUNTER — Other Ambulatory Visit: Payer: Self-pay

## 2021-04-16 ENCOUNTER — Encounter (INDEPENDENT_AMBULATORY_CARE_PROVIDER_SITE_OTHER): Payer: Self-pay

## 2021-04-16 ENCOUNTER — Ambulatory Visit: Payer: Self-pay | Admitting: *Deleted

## 2021-04-16 ENCOUNTER — Ambulatory Visit: Payer: Self-pay

## 2021-04-16 VITALS — BP 116/78 | Wt 206.4 lb

## 2021-04-16 DIAGNOSIS — Z1231 Encounter for screening mammogram for malignant neoplasm of breast: Secondary | ICD-10-CM

## 2021-04-16 DIAGNOSIS — Z1239 Encounter for other screening for malignant neoplasm of breast: Secondary | ICD-10-CM

## 2021-04-16 NOTE — Patient Instructions (Signed)
Explained breast self awareness with Malva Cogan. Patient did not need a Pap smear today due to last Pap smear and HPV typing was 12/06/2020. Let her know BCCCP will cover Pap smears and HPV typing every 5 years unless has a history of abnormal Pap smears. Referred patient to the [facility] of [facility] for a screening mammogram on mobile unit. Appointment scheduled Tuesday, April 16, 2021 at 1550. Patient aware of appointment and will be there. Let patient know the Breast Center will follow up with her within the next couple weeks with results of her mammogram by letter or phone. Tyrihanna Wingert verbalized understanding.  Rosanna Bickle, Kathaleen Maser, RN 1:08 PM
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
## 2023-02-23 ENCOUNTER — Encounter (HOSPITAL_COMMUNITY): Payer: Self-pay

## 2023-02-23 ENCOUNTER — Emergency Department (HOSPITAL_COMMUNITY): Payer: No Typology Code available for payment source

## 2023-02-23 ENCOUNTER — Emergency Department (HOSPITAL_COMMUNITY)
Admission: EM | Admit: 2023-02-23 | Discharge: 2023-02-24 | Disposition: A | Discharge status: Home / Self Care | Payer: No Typology Code available for payment source | Attending: Emergency Medicine | Admitting: Emergency Medicine

## 2023-02-23 DIAGNOSIS — S0990XA Unspecified injury of head, initial encounter: Secondary | ICD-10-CM

## 2023-02-23 DIAGNOSIS — S060X0A Concussion without loss of consciousness, initial encounter: Secondary | ICD-10-CM | POA: Insufficient documentation

## 2023-02-23 DIAGNOSIS — S0083XA Contusion of other part of head, initial encounter: Secondary | ICD-10-CM | POA: Insufficient documentation

## 2023-02-23 MED ORDER — ACETAMINOPHEN 500 MG PO TABS
1000.0000 mg | ORAL_TABLET | Freq: Once | ORAL | Status: AC
  Administered 2023-02-23: 1000 mg via ORAL
  Filled 2023-02-23: qty 2
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
## 2023-02-24 ENCOUNTER — Other Ambulatory Visit: Payer: Self-pay

## 2023-02-24 ENCOUNTER — Emergency Department (HOSPITAL_COMMUNITY): Payer: No Typology Code available for payment source

## 2023-02-24 LAB — POC URINE PREG, ED: Preg Test, Ur: NEGATIVE

## 2023-02-24 MED ORDER — IBUPROFEN 800 MG PO TABS
800.0000 mg | ORAL_TABLET | Freq: Once | ORAL | Status: AC
  Administered 2023-02-24: 800 mg via ORAL
  Filled 2023-02-24: qty 1

## 2023-02-24 MED ORDER — ONDANSETRON 4 MG PO TBDP
4.0000 mg | ORAL_TABLET | Freq: Three times a day (TID) | ORAL | 0 refills | Status: AC | PRN
Start: 2023-02-24 — End: ?
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
## 2023-02-24 NOTE — Discharge Instructions (Addendum)
Your imaging was negative, this is reassuring, please follow-up with your primary care doctor, and take Motrin, and Tylenol for headache.  I have sent you some nausea medicine to help with your nausea.

## 2024-02-25 ENCOUNTER — Other Ambulatory Visit: Payer: Self-pay | Admitting: Obstetrics and Gynecology

## 2024-02-25 DIAGNOSIS — Z1231 Encounter for screening mammogram for malignant neoplasm of breast: Secondary | ICD-10-CM

## 2024-05-19 ENCOUNTER — Ambulatory Visit

## 2024-05-19 ENCOUNTER — Encounter
